# Patient Record
Sex: Female | Born: 1950 | Race: Black or African American | Hispanic: No | Marital: Single | Smoking: Former smoker
Health system: Southern US, Community
[De-identification: ages and names within clinical notes are randomized; demographics above are authoritative.]

## PROBLEM LIST (undated history)

## (undated) DIAGNOSIS — K219 Gastro-esophageal reflux disease without esophagitis: Secondary | ICD-10-CM

## (undated) DIAGNOSIS — F329 Major depressive disorder, single episode, unspecified: Secondary | ICD-10-CM

## (undated) DIAGNOSIS — J45909 Unspecified asthma, uncomplicated: Secondary | ICD-10-CM

## (undated) DIAGNOSIS — J310 Chronic rhinitis: Secondary | ICD-10-CM

## (undated) DIAGNOSIS — M1990 Unspecified osteoarthritis, unspecified site: Secondary | ICD-10-CM

## (undated) DIAGNOSIS — E119 Type 2 diabetes mellitus without complications: Secondary | ICD-10-CM

## (undated) DIAGNOSIS — F32A Depression, unspecified: Secondary | ICD-10-CM

## (undated) DIAGNOSIS — K5792 Diverticulitis of intestine, part unspecified, without perforation or abscess without bleeding: Secondary | ICD-10-CM

## (undated) DIAGNOSIS — J40 Bronchitis, not specified as acute or chronic: Secondary | ICD-10-CM

## (undated) DIAGNOSIS — I10 Essential (primary) hypertension: Secondary | ICD-10-CM

## (undated) DIAGNOSIS — F419 Anxiety disorder, unspecified: Secondary | ICD-10-CM

## (undated) HISTORY — DX: Type 2 diabetes mellitus without complications: E11.9

## (undated) HISTORY — DX: Diverticulitis of intestine, part unspecified, without perforation or abscess without bleeding: K57.92

## (undated) HISTORY — DX: Unspecified asthma, uncomplicated: J45.909

## (undated) HISTORY — DX: Essential (primary) hypertension: I10

## (undated) HISTORY — PX: OTHER SURGICAL HISTORY: SHX169

## (undated) HISTORY — PX: APPENDECTOMY: SHX54

---

## 2007-12-21 ENCOUNTER — Ambulatory Visit: Payer: Self-pay | Admitting: Internal Medicine

## 2008-01-08 ENCOUNTER — Ambulatory Visit: Payer: Self-pay | Admitting: Internal Medicine

## 2008-02-14 ENCOUNTER — Ambulatory Visit: Payer: Self-pay | Admitting: Internal Medicine

## 2008-03-05 ENCOUNTER — Ambulatory Visit: Payer: Self-pay | Admitting: Family Medicine

## 2008-04-27 ENCOUNTER — Ambulatory Visit: Payer: Self-pay | Admitting: Internal Medicine

## 2008-07-02 ENCOUNTER — Ambulatory Visit: Payer: Self-pay | Admitting: Internal Medicine

## 2008-09-25 ENCOUNTER — Ambulatory Visit: Payer: Self-pay | Admitting: Internal Medicine

## 2008-12-18 ENCOUNTER — Ambulatory Visit: Payer: Self-pay | Admitting: Internal Medicine

## 2008-12-19 ENCOUNTER — Ambulatory Visit: Payer: Self-pay | Admitting: Internal Medicine

## 2009-05-18 ENCOUNTER — Ambulatory Visit: Payer: Self-pay | Admitting: Internal Medicine

## 2009-06-11 ENCOUNTER — Ambulatory Visit: Payer: Self-pay | Admitting: Internal Medicine

## 2009-10-18 ENCOUNTER — Ambulatory Visit: Payer: Self-pay | Admitting: Internal Medicine

## 2009-12-26 ENCOUNTER — Ambulatory Visit: Payer: Self-pay | Admitting: Internal Medicine

## 2009-12-30 ENCOUNTER — Ambulatory Visit: Payer: Self-pay | Admitting: Internal Medicine

## 2010-04-04 ENCOUNTER — Ambulatory Visit: Payer: Self-pay | Admitting: Internal Medicine

## 2010-04-18 ENCOUNTER — Ambulatory Visit: Payer: Self-pay | Admitting: Internal Medicine

## 2010-04-22 ENCOUNTER — Ambulatory Visit: Payer: Self-pay | Admitting: Internal Medicine

## 2010-07-19 ENCOUNTER — Ambulatory Visit: Payer: Self-pay | Admitting: Internal Medicine

## 2010-11-06 ENCOUNTER — Ambulatory Visit: Payer: Self-pay | Admitting: Family Medicine

## 2010-12-03 ENCOUNTER — Ambulatory Visit: Payer: Self-pay | Admitting: Family Medicine

## 2010-12-17 ENCOUNTER — Ambulatory Visit: Payer: Self-pay | Admitting: Family Medicine

## 2011-01-14 ENCOUNTER — Ambulatory Visit: Payer: Self-pay | Admitting: Family Medicine

## 2011-01-21 ENCOUNTER — Ambulatory Visit: Payer: Self-pay | Admitting: Family Medicine

## 2011-03-08 ENCOUNTER — Ambulatory Visit: Payer: Self-pay

## 2011-04-21 DIAGNOSIS — K5792 Diverticulitis of intestine, part unspecified, without perforation or abscess without bleeding: Secondary | ICD-10-CM

## 2011-04-21 HISTORY — PX: COLON RESECTION: SHX5231

## 2011-04-21 HISTORY — DX: Diverticulitis of intestine, part unspecified, without perforation or abscess without bleeding: K57.92

## 2011-04-21 HISTORY — PX: HERNIA REPAIR: SHX51

## 2011-05-18 ENCOUNTER — Ambulatory Visit: Payer: Self-pay | Admitting: Internal Medicine

## 2011-05-24 ENCOUNTER — Ambulatory Visit: Payer: Self-pay

## 2011-05-24 LAB — WET PREP, GENITAL

## 2011-06-25 ENCOUNTER — Ambulatory Visit: Payer: Self-pay | Admitting: Family Medicine

## 2011-06-30 ENCOUNTER — Ambulatory Visit: Payer: Self-pay | Admitting: Internal Medicine

## 2011-07-07 ENCOUNTER — Ambulatory Visit: Payer: Self-pay | Admitting: Internal Medicine

## 2011-08-05 ENCOUNTER — Ambulatory Visit: Payer: Self-pay | Admitting: Internal Medicine

## 2011-09-01 ENCOUNTER — Ambulatory Visit: Payer: Self-pay | Admitting: Internal Medicine

## 2011-09-17 ENCOUNTER — Emergency Department: Payer: Self-pay | Admitting: Unknown Physician Specialty

## 2011-09-17 ENCOUNTER — Ambulatory Visit: Payer: Self-pay | Admitting: Medical

## 2011-09-17 LAB — CBC WITH DIFFERENTIAL/PLATELET
Basophil #: 0 10*3/uL (ref 0.0–0.1)
Basophil %: 0.5 %
Eosinophil %: 1.4 %
Lymphocyte %: 34.6 %
MCHC: 32.8 g/dL (ref 32.0–36.0)
MCV: 90 fL (ref 80–100)
Monocyte #: 0.7 x10 3/mm (ref 0.2–0.9)
Neutrophil #: 4.7 10*3/uL (ref 1.4–6.5)
Neutrophil %: 55.6 %
Platelet: 179 10*3/uL (ref 150–440)
RBC: 4.96 10*6/uL (ref 3.80–5.20)
RDW: 13 % (ref 11.5–14.5)
WBC: 8.5 10*3/uL (ref 3.6–11.0)

## 2011-09-17 LAB — COMPREHENSIVE METABOLIC PANEL
Albumin: 3.7 g/dL (ref 3.4–5.0)
Alkaline Phosphatase: 78 U/L (ref 50–136)
BUN: 13 mg/dL (ref 7–18)
Chloride: 105 mmol/L (ref 98–107)
Co2: 27 mmol/L (ref 21–32)
Creatinine: 0.94 mg/dL (ref 0.60–1.30)
Glucose: 110 mg/dL — ABNORMAL HIGH (ref 65–99)
Osmolality: 282 (ref 275–301)
Potassium: 3.5 mmol/L (ref 3.5–5.1)
Sodium: 141 mmol/L (ref 136–145)
Total Protein: 8.2 g/dL (ref 6.4–8.2)

## 2011-09-17 LAB — LIPASE, BLOOD: Lipase: 132 U/L (ref 73–393)

## 2011-09-17 LAB — URINALYSIS, COMPLETE
Bacteria: NONE SEEN
Blood: NEGATIVE
Glucose,UR: NEGATIVE mg/dL (ref 0–75)
Ketone: NEGATIVE
Specific Gravity: 1.01 (ref 1.003–1.030)

## 2011-10-14 ENCOUNTER — Ambulatory Visit: Payer: Self-pay | Admitting: Family Medicine

## 2011-11-17 ENCOUNTER — Ambulatory Visit: Payer: Self-pay | Admitting: Internal Medicine

## 2011-12-03 ENCOUNTER — Ambulatory Visit: Payer: Self-pay | Admitting: Surgery

## 2011-12-03 DIAGNOSIS — I10 Essential (primary) hypertension: Secondary | ICD-10-CM

## 2011-12-03 LAB — HEPATIC FUNCTION PANEL A (ARMC)
Albumin: 3.9 g/dL (ref 3.4–5.0)
Alkaline Phosphatase: 76 U/L (ref 50–136)
Bilirubin, Direct: <0.1 mg/dL (ref 0.00–0.20)
Bilirubin,Total: 0.5 mg/dL (ref 0.2–1.0)
Total Protein: 8.1 g/dL (ref 6.4–8.2)

## 2011-12-03 LAB — BASIC METABOLIC PANEL
Calcium, Total: 9.6 mg/dL (ref 8.5–10.1)
Chloride: 100 mmol/L (ref 98–107)
Co2: 28 mmol/L (ref 21–32)
Creatinine: 0.85 mg/dL (ref 0.60–1.30)
Potassium: 3.7 mmol/L (ref 3.5–5.1)
Sodium: 138 mmol/L (ref 136–145)

## 2011-12-03 LAB — CBC WITH DIFFERENTIAL/PLATELET
Basophil #: 0 10*3/uL (ref 0.0–0.1)
Eosinophil #: 0.1 10*3/uL (ref 0.0–0.7)
HCT: 42.2 % (ref 35.0–47.0)
Lymphocyte #: 1.8 10*3/uL (ref 1.0–3.6)
MCHC: 34 g/dL (ref 32.0–36.0)
MCV: 88 fL (ref 80–100)
Monocyte #: 0.4 x10 3/mm (ref 0.2–0.9)
Neutrophil #: 3.3 10*3/uL (ref 1.4–6.5)
Platelet: 183 10*3/uL (ref 150–440)
RBC: 4.8 10*6/uL (ref 3.80–5.20)
RDW: 12.8 % (ref 11.5–14.5)
WBC: 5.6 10*3/uL (ref 3.6–11.0)

## 2011-12-11 ENCOUNTER — Inpatient Hospital Stay: Payer: Self-pay | Admitting: Surgery

## 2011-12-12 LAB — BASIC METABOLIC PANEL
Anion Gap: 8 (ref 7–16)
Chloride: 107 mmol/L (ref 98–107)
Co2: 26 mmol/L (ref 21–32)
EGFR (African American): >60
EGFR (Non-African Amer.): >60
Osmolality: 282 (ref 275–301)
Potassium: 3.8 mmol/L (ref 3.5–5.1)
Sodium: 141 mmol/L (ref 136–145)

## 2011-12-12 LAB — CBC WITH DIFFERENTIAL/PLATELET
Basophil %: 0.1 %
Eosinophil #: 0 10*3/uL (ref 0.0–0.7)
HCT: 37.4 % (ref 35.0–47.0)
Lymphocyte %: 5.8 %
MCH: 30.7 pg (ref 26.0–34.0)
MCV: 88 fL (ref 80–100)
Monocyte #: 0.7 x10 3/mm (ref 0.2–0.9)
Neutrophil #: 13.6 10*3/uL — ABNORMAL HIGH (ref 1.4–6.5)
Platelet: 178 10*3/uL (ref 150–440)
RDW: 12.9 % (ref 11.5–14.5)

## 2011-12-13 LAB — CBC WITH DIFFERENTIAL/PLATELET
Eosinophil %: 0.2 %
Lymphocyte #: 1.8 10*3/uL (ref 1.0–3.6)
Lymphocyte %: 16.5 %
MCHC: 35.1 g/dL (ref 32.0–36.0)
MCV: 88 fL (ref 80–100)
Monocyte %: 7.2 %
Neutrophil #: 8.2 10*3/uL — ABNORMAL HIGH (ref 1.4–6.5)
Neutrophil %: 75.9 %
Platelet: 179 10*3/uL (ref 150–440)
RDW: 13.2 % (ref 11.5–14.5)
WBC: 10.8 10*3/uL (ref 3.6–11.0)

## 2011-12-13 LAB — BASIC METABOLIC PANEL
Anion Gap: 6 — ABNORMAL LOW (ref 7–16)
BUN: 7 mg/dL (ref 7–18)
Calcium, Total: 8.5 mg/dL (ref 8.5–10.1)
Creatinine: 0.95 mg/dL (ref 0.60–1.30)
EGFR (African American): >60
EGFR (Non-African Amer.): >60
Glucose: 113 mg/dL — ABNORMAL HIGH (ref 65–99)
Osmolality: 280 (ref 275–301)
Potassium: 3.4 mmol/L — ABNORMAL LOW (ref 3.5–5.1)
Sodium: 141 mmol/L (ref 136–145)

## 2011-12-15 LAB — CBC WITH DIFFERENTIAL/PLATELET
Basophil %: 0.3 %
Eosinophil %: 1.2 %
HGB: 11.5 g/dL — ABNORMAL LOW (ref 12.0–16.0)
Lymphocyte #: 1.5 10*3/uL (ref 1.0–3.6)
MCH: 30.8 pg (ref 26.0–34.0)
MCV: 89 fL (ref 80–100)
Monocyte #: 0.6 x10 3/mm (ref 0.2–0.9)
Neutrophil #: 8.6 10*3/uL — ABNORMAL HIGH (ref 1.4–6.5)
Platelet: 174 10*3/uL (ref 150–440)
RBC: 3.73 10*6/uL — ABNORMAL LOW (ref 3.80–5.20)
RDW: 12.6 % (ref 11.5–14.5)

## 2011-12-15 LAB — BASIC METABOLIC PANEL
Calcium, Total: 8.4 mg/dL — ABNORMAL LOW (ref 8.5–10.1)
Chloride: 102 mmol/L (ref 98–107)
Co2: 32 mmol/L (ref 21–32)
Creatinine: 0.71 mg/dL (ref 0.60–1.30)
EGFR (African American): >60
Glucose: 145 mg/dL — ABNORMAL HIGH (ref 65–99)
Potassium: 2.9 mmol/L — ABNORMAL LOW (ref 3.5–5.1)
Sodium: 138 mmol/L (ref 136–145)

## 2011-12-16 LAB — PATHOLOGY REPORT

## 2011-12-17 LAB — CBC WITH DIFFERENTIAL/PLATELET
Basophil %: 0.4 %
Eosinophil %: 2.6 %
HCT: 33.2 % — ABNORMAL LOW (ref 35.0–47.0)
Lymphocyte #: 2.3 10*3/uL (ref 1.0–3.6)
Lymphocyte %: 21.7 %
MCH: 31 pg (ref 26.0–34.0)
MCV: 89 fL (ref 80–100)
Monocyte #: 0.9 x10 3/mm (ref 0.2–0.9)
Monocyte %: 7.9 %
Neutrophil #: 7.3 10*3/uL — ABNORMAL HIGH (ref 1.4–6.5)

## 2011-12-17 LAB — BASIC METABOLIC PANEL
Chloride: 97 mmol/L — ABNORMAL LOW (ref 98–107)
Co2: 35 mmol/L — ABNORMAL HIGH (ref 21–32)
Creatinine: 0.96 mg/dL (ref 0.60–1.30)
EGFR (African American): >60
Sodium: 140 mmol/L (ref 136–145)

## 2012-01-03 ENCOUNTER — Ambulatory Visit: Payer: Self-pay

## 2012-01-07 ENCOUNTER — Ambulatory Visit: Payer: Self-pay

## 2012-03-12 ENCOUNTER — Ambulatory Visit: Payer: Self-pay | Admitting: Internal Medicine

## 2012-03-16 ENCOUNTER — Ambulatory Visit: Payer: Self-pay | Admitting: Internal Medicine

## 2012-06-16 ENCOUNTER — Ambulatory Visit: Payer: Self-pay

## 2012-09-13 ENCOUNTER — Ambulatory Visit: Payer: Self-pay | Admitting: Family Medicine

## 2012-10-20 ENCOUNTER — Ambulatory Visit: Payer: Self-pay

## 2013-04-20 HISTORY — PX: COLONOSCOPY: SHX174

## 2013-09-19 ENCOUNTER — Ambulatory Visit: Payer: Self-pay | Admitting: Family Medicine

## 2013-09-26 ENCOUNTER — Encounter: Payer: Self-pay | Admitting: Family Medicine

## 2013-09-27 ENCOUNTER — Ambulatory Visit: Payer: Self-pay | Admitting: Family Medicine

## 2013-10-13 ENCOUNTER — Ambulatory Visit: Payer: Self-pay

## 2013-10-18 ENCOUNTER — Encounter: Payer: Self-pay | Admitting: Family Medicine

## 2013-10-26 ENCOUNTER — Ambulatory Visit: Payer: Self-pay

## 2014-01-02 ENCOUNTER — Ambulatory Visit: Payer: Self-pay | Admitting: Family Medicine

## 2014-01-08 ENCOUNTER — Ambulatory Visit: Payer: Self-pay

## 2014-01-11 ENCOUNTER — Ambulatory Visit: Payer: Self-pay

## 2014-02-19 ENCOUNTER — Ambulatory Visit: Payer: Self-pay | Admitting: Physician Assistant

## 2014-04-18 ENCOUNTER — Ambulatory Visit: Payer: Self-pay | Admitting: Emergency Medicine

## 2014-04-20 HISTORY — PX: SHOULDER ARTHROSCOPY W/ ROTATOR CUFF REPAIR: SHX2400

## 2014-04-26 ENCOUNTER — Ambulatory Visit: Payer: Self-pay

## 2014-06-08 ENCOUNTER — Ambulatory Visit: Payer: Self-pay

## 2014-06-26 ENCOUNTER — Ambulatory Visit: Payer: Self-pay | Admitting: Specialist

## 2014-06-30 ENCOUNTER — Ambulatory Visit: Payer: Self-pay | Admitting: Emergency Medicine

## 2014-07-16 ENCOUNTER — Ambulatory Visit: Payer: Self-pay | Admitting: Orthopedic Surgery

## 2014-07-16 LAB — URINALYSIS, COMPLETE
BACTERIA: NONE SEEN
Bilirubin,UR: NEGATIVE
Blood: NEGATIVE
Glucose,UR: NEGATIVE mg/dL (ref 0–75)
Ketone: NEGATIVE
Leukocyte Esterase: NEGATIVE
NITRITE: NEGATIVE
Ph: 6 (ref 4.5–8.0)
Protein: NEGATIVE
RBC, UR: NONE SEEN /HPF (ref 0–5)
Specific Gravity: 1.01 (ref 1.003–1.030)
Squamous Epithelial: <1
WBC UR: NONE SEEN /HPF (ref 0–5)

## 2014-07-16 LAB — PROTIME-INR
INR: 1
Prothrombin Time: 12.9 secs

## 2014-07-16 LAB — APTT: ACTIVATED PTT: 26.9 s (ref 23.6–35.9)

## 2014-07-16 LAB — BASIC METABOLIC PANEL
Anion Gap: 8 (ref 7–16)
BUN: 10 mg/dL
CALCIUM: 9.3 mg/dL
Chloride: 104 mmol/L
Co2: 27 mmol/L
Creatinine: 0.78 mg/dL
EGFR (African American): >60
Glucose: 116 mg/dL — ABNORMAL HIGH
POTASSIUM: 3.6 mmol/L
Sodium: 139 mmol/L

## 2014-07-16 LAB — CBC
HCT: 40.4 % (ref 35.0–47.0)
HGB: 13.3 g/dL (ref 12.0–16.0)
MCH: 30.1 pg (ref 26.0–34.0)
MCHC: 33 g/dL (ref 32.0–36.0)
MCV: 91 fL (ref 80–100)
PLATELETS: 246 10*3/uL (ref 150–440)
RBC: 4.43 10*6/uL (ref 3.80–5.20)
RDW: 13.4 % (ref 11.5–14.5)
WBC: 7 10*3/uL (ref 3.6–11.0)

## 2014-07-16 LAB — MRSA PCR SCREENING

## 2014-07-26 ENCOUNTER — Ambulatory Visit
Admit: 2014-07-26 | Disposition: A | Discharge status: Home / Self Care | Payer: Self-pay | Attending: Orthopedic Surgery | Admitting: Orthopedic Surgery

## 2014-08-07 NOTE — Op Note (Signed)
PATIENT NAME:  Brenda Lin, Brenda Lin MR#:  [id number] DATE OF BIRTH:  01/17/1951  DATE OF PROCEDURE:  12/11/2011  PREOPERATIVE DIAGNOSIS: recurrent sigmoid diverticulitis   POSTOPERATIVE DIAGNOSIS: same.   PROCEDURES:  1. Laparoscopic assisted sigmoid colectomy.  2. Appendectomy.   SURGEON: Jadden Yim A. Marina Gravel, MD   ASSISTANT: Dr. Genevive Bi, MD   ANESTHESIA: General oral endotracheal.   FINDINGS: Scarring of the sigmoid colon, normal appendix.   SPECIMENS: Portion of sigmoid colon and appendix to pathology.  ESTIMATED BLOOD LOSS: 100 mL.   DRAINS: None.  LAP AND NEEDLE COUNT: Correct x2.   DESCRIPTION OF PROCEDURE: With the patient in the supine position, general endotracheal anesthesia was induced. She was positioned in dorsal lithotomy. Foley catheter was placed. Hair was clipped on the abdomen. Perineum was prepped with Betadine. Abdomen with choraprep solution. Time-out was observed. A 12 mm hasson was placed through a supraumbilical midline skin incision. Under direct visualization pneumoperitoneum was established. A 5 mm trocar was placed in the right lower quadrant, one in the left upper quadrant, one in the left lower quadrant. The sigmoid colon was reflected off the white line of Toldt utilizing the laparoscopic LigaSure device. Splenic flexure was likewise delivered. At this point the sigmoid colon following mobilization of the splenic flexure was sufficiently redundant that a midline skin incision was fashioned from above the umbilicus to a couple of centimeters to below the umbilicus. Skin was divided with a scalpel and electrocautery used to divide the musculofascial layers. Self-retaining retractor was placed. Sigmoid colon was redundant and the proximal portion was divided in the healthy appearing segment with a single fire of  Contour 40 stapler. The sigmoid mesentery was then divided and tied with #0 Vicryl suture as well as the application of the LigaSure device. Distally a segment of  healthy  distal sigmoid colon was divided with the countour 40 stapler.   Plan initially was to performed a stapled end-to end nastomosis with the 25 mm ILS stapler but the stapler was unable to be safely passed via the rectum to the distal staple line.  As such, a hand sewn double layered end-to end anastomosis was then created with 3-0 silk and 3-0 pds as running Connel type suture.  5 ml of fibrin glue was placed on the staple line and the anastomosis was tested with dital insluffation until saline irigation along with proximal occlusion.   An incidental appendectomy was performed with division of the appendix at its base with a GIA-75 stapler and the mesoappendix with Ligasure energy device.  The staple inewas imbricated with a 3-0 silk sutures.     The abdomen was then thoroughly irrigated with one liter of normal saline and aspirated dry Subcutaneous tissues were irrigated with saine and dilute betadiene.  Skin edges were reapproximated utilizing a skin stapler. Sterile dressings were applied. The patient was then subsequently extubated and taken to the recovery room in stable and satisfactory condition by anesthesia services.   ____________________________ Jeannette How Marina Gravel, MD FACS mab:drc D: 12/12/2011 22:07:02 ET T: 12/13/2011 11:29:12 ET JOB#: [id number]  cc: Elta Guadeloupe A. Marina Gravel, MD, <Dictator> Quinterrius Errington A Render Marley MD ELECTRONICALLY SIGNED 12/14/2011 10:04

## 2014-08-07 NOTE — Discharge Summary (Signed)
PATIENT NAME:  Brenda Lin, Brenda Lin MR#:  [id number] DATE OF BIRTH:  Dec 21, 1950  DATE OF ADMISSION:  12/11/2011 DATE OF DISCHARGE:  12/18/2011  FINAL DIAGNOSES:  1. Recurrent sigmoid diverticulitis.  2. Hypertension.  3. Obesity.   PRINCIPLE PROCEDURE: Laparoscopic assisted sigmoid colectomy.   HOSPITAL COURSE SUMMARY: The patient was brought to the Operating Room on 08/23 where a laparoscopic assisted sigmoid colectomy was performed. Postoperatively she did very well. She had resolution of her ileus by postoperative day #3. Her diet was able to be slowly advanced. She had adequate pain control with use of PCA which was then converted over to oral tramadol and Percocet. She was also given a trial of Toradol which she tolerated well. Her potassium was somewhat low and was repleted. Her hemoglobin remained stable. Wound was healing nicely and by postoperative day #7 the patient was deemed suitable for discharge.   DISCHARGE MEDICATIONS:  1. Percocet 5/325, 1 to 2 tabs every 4 to 6 hours as needed for pain.  2. Meloxicam 50 mg by mouth once a day. 3. Proventil HFA 2 puffs inhaled q.i.d. as needed. 4. Phenergan as needed.  5. Metformin 500 mg by mouth once a day. 6. Albuterol inhaler as needed.  7. Aldactone 25 mg by mouth b.i.d.  8. Allopurinol 300 mg by mouth once a day. 9. Indomethacin 25 mg by mouth 3 times a day as needed for pain.  10. Pravastatin 40 mg by mouth once a day at bedtime. 11. Diazepam 5 mg by mouth once a day as needed.  12. Amlodipine 5 mg by mouth once a day in the morning. 13. Hydrochlorothiazide 25 mg by mouth 1 tab in the morning.    FOLLOW UP: Follow up with me on Tuesday, 09/03, for staple removal. Call with any questions or concerns.   ____________________________ Jeannette How Marina Gravel, MD mab:cms D: 12/18/2011 08:26:46 ET T: 12/18/2011 10:50:39 ET JOB#: [id number]  cc: Elta Guadeloupe A. Marina Gravel, MD, <Dictator>  Hortencia Conradi MD ELECTRONICALLY SIGNED 12/18/2011 18:19

## 2014-08-19 NOTE — Op Note (Addendum)
PATIENT NAME:  Brenda Lin, Brenda Lin MR#:  [id number] DATE OF BIRTH:  Dec 31, 1950  DATE OF PROCEDURE:  07/26/2014  PREOPERATIVE DIAGNOSES: Left shoulder rotator cuff tear, subacromial impingement, and acromioclavicular joint arthrosis.   POSTOPERATIVE DIAGNOSES:  Left shoulder rotator cuff tear, subacromial impingement, and acromioclavicular joint arthrosis.   PROCEDURE: Left shoulder arthroscopic subacromial decompression and distal clavicle excision with mini open rotator cuff tear repair.   SURGEON: Timoteo Gaul, MD   ANESTHESIA: General with local with 1% lidocaine plain for the incisions, and 0.25% Marcaine plain infusion into the subacromial space at the conclusion of the case.   ESTIMATED BLOOD LOSS: Minimal.   COMPLICATIONS: None.   IMPLANTS: ArthroCare Magnum M anchor x 1 and Magnum 2 anchors x 3.   INDICATIONS FOR PROCEDURE: The patient is a 64 year old female who has had persistent pain and limitation of motion with the left shoulder. An MRI has revealed a full-thickness rotator cuff tear involving the supraspinatus and infraspinatus with retraction. I had recommended surgical fixation for this when she did not respond to nonoperative management. I had reviewed the risks and benefits of the procedure with the patient, which includes infection, bleeding, nerve or blood vessel injury, shoulder stiffness, persistent left shoulder pain or weakness, failure of the hardware, and the need for further surgery. Medical risks include, but are not limited to DVT and pulmonary embolism, myocardial infarction, stroke, pneumonia, respiratory failure, and death. The patient understood these risks and wished to proceed.   The patient was met in the preoperative area. I answered all questions by the patient and her family. I marked the left shoulder with the word "yes," according to the hospital's correct site of surgery protocol after verbally confirming with the patient that this is the correct site  of surgery and confirming with my office notes and radiographic studies.   DESCRIPTION OF PROCEDURE: The patient was brought to the operating room. She had a history of COPD and was, therefore, not given an interscalene block. She underwent general anesthesia and was positioned in the beach chair position. All bony prominences were adequately padded, including the lower extremities. A Spider arm positioner was used for this case. The patient was prepped and draped in a sterile fashion. A timeout was performed to verify the patient's name, date of birth, medical record number, correct site of surgery, and correct procedure to be performed. It was also used to verify this patient had received antibiotics and that all proper instruments, implants and radiographic studies were available in the room. Once all in attendance were in agreement, the case began.   Examination under anesthesia revealed no instability to load and shift testing in the anterior and posterior direction. She had negative sulcus sign and full passive range of motion.   The patient had bony landmarks drawn out with a surgical marker, along with proposed arthroscopy incisions. These were pre- injected with 1% lidocaine plain. An 11 blade was used to establish the posterior portal, through which the arthroscope was placed into the glenohumeral joint. A full diagnostic examination of the shoulder was undertaken. Findings on arthroscopy included fraying of the anterior, superior, and posterior labrum without detachment from the bony glenoid. The biceps tendon anchor appeared to be intact. The patient did have moderate chondral wear of the glenoid and humeral head. The patient had a full-thickness tear of the supraspinatus and infraspinatus with retraction to the glenoid. There was a partial thickness tear involving the inferior fibers of the subscapularis, but there was not  complete detachment from the lesser tuberosity. An anterior portal was  established using an 18-gauge spinal needle for localization. A 4-0 resector shaver blade was placed through this anterior portal, and the debridement of the frayed edges of the labrum was performed, along with debridement of the torn edges of the rotator cuff, and any remaining fibers of the rotator cuff on the greater tuberosity. The arthroscope was placed into the subacromial space. A lateral portal was established; again, using an 18-gauge spinal needle for localization. A subacromial decompression was performed through this lateral portal using a 5.5 mm resector shaver blade. A distal clavicle excision was also performed using the 5.5 mm resector blade through the anterior portal. Through the lateral portal, 3 Smart stitches were placed on the lateral edge of the rotator cuff tear. The tear was evaluated through the anterior, posterior, and lateral portals to ensure that the sutures were appropriately placed. An arthroscopic elevator was used to mobilize the rotator cuff tendon.   All arthroscopic instruments were then removed. A saber-type incision was made along the lateral border of the acromion. The deltoid fascia was identified and split vertically in line with its fibers. A self-retaining shoulder retractor was placed to allow for visualization of the rotator cuff tear. The Smart stitch sutures placed earlier were then brought out through the deltoid split. A 5.5 mm resector shaver blade was then used to gently burr the greater tuberosity, removing all remaining torn fibers of the rotator cuff. Punctate bleeding was identified, which will assist in healing of the rotator cuff tear. A Magnum M anchor was then placed at the articular margin of the humeral head. The four sutures from this anchor were then passed through the medial portion of the torn resector cuff using a first-pass suture passer. These sutures were then clamped for later repair. The Smart stitches originally placed were then placed using  Magnum 2 anchors for fixation to the lateral row. These were tensioned to allow for reduction of the torn rotator cuff to the greater tuberosity. Once there was adequate coverage of the humeral hand, a medial row was then tied down using an arthroscopic knot pusher using an arthroscopic knot-tying technique. Final images of the rotator cuff repair were taken both externally and arthroscopically from the glenohumeral joint. There was excellent approximation of the rotator cuff to the greater tuberosity. The subacromial space and the glenohumeral joint were then copiously irrigated. Final images were taken of the rotator cuff repair using the arthroscope and all instruments were then removed. The deltoid fascia was closed using an interrupted 0 Vicryl. The subcutaneous tissue of all incisions were closed with 2-0 Vicryl, and the skin of the 3 arthroscopic portals was closed with 4-0 nylon, and the skin of the saber incision was closed with a running 4-0 undyed Monocryl. Steri-Strips were applied, along with a dry sterile dressing. TENS unit leads and a Polar Care sleeve were also applied, along with an abduction sling to the left shoulder. The patient was then awakened and transferred to a hospital stretcher and brought to the PACU in stable condition. I spoke with the patient's family, postoperatively, to let them know the case had gone without complication, and the patient was stable in the recovery.. The patient was neurovascularly intact in the left upper extremity in the PACU.    ____________________________ Timoteo Gaul, MD klk:mw D: 07/27/2014 14:02:32 ET T: 07/27/2014 14:29:38 ET JOB#: [id number]  cc: Timoteo Gaul, MD, <Dictator> Timoteo Gaul MD ELECTRONICALLY SIGNED 08/20/2014  12:59

## 2014-09-24 ENCOUNTER — Encounter: Payer: Self-pay | Admitting: *Deleted

## 2014-10-02 ENCOUNTER — Encounter: Payer: Self-pay | Admitting: General Surgery

## 2014-10-02 ENCOUNTER — Other Ambulatory Visit: Payer: Medicare Other

## 2014-10-02 ENCOUNTER — Ambulatory Visit (INDEPENDENT_AMBULATORY_CARE_PROVIDER_SITE_OTHER): Payer: Medicare Other | Admitting: General Surgery

## 2014-10-02 VITALS — BP 122/82 | HR 80 | Resp 14 | Ht 65.0 in | Wt 180.0 lb

## 2014-10-02 DIAGNOSIS — N6452 Nipple discharge: Secondary | ICD-10-CM | POA: Diagnosis not present

## 2014-10-02 NOTE — Patient Instructions (Addendum)
The patient is aware to call back for any questions or concerns. Continue self breast exams. Call office for any new breast issues or concerns. 

## 2014-10-02 NOTE — Progress Notes (Addendum)
Patient ID: Brenda Lin, female   DOB: Dec 30, 1950, 64 y.o.   MRN: [id number]  Chief Complaint  Patient presents with  . Other    Eval MRSA on nipple    HPI Brenda Lin is a 64 y.o. female here today for an evaluation of possible MRSA involving the left nipple. She got a hydrocortisone injection in the left shoulder March 2016. After that she then developed a irritation on her right flank that was reported as MRSA (although no culture was obtained) and she was treated with oral and a biotics with complete resolution. She noticed a cyst/crust area on the left nipple shortly after this and it would drain white drainage with odor when she squeezed the area. No spontaneous drainage.    07-16-14 nasal screen negative for MRSA. She then had shoulder surgery in April by Dr. Mack Guise.   She denies pain to the nipple. She does use Bactroban ointment to nipple twice a day. She not seen any drainage today but it did drain last night. No cultures have been taken.    HPI  Past Medical History  Diagnosis Date  . Hypertension   . Diabetes mellitus without complication   . Asthma   . Diverticulitis 2013    Past Surgical History  Procedure Laterality Date  . Hernia repair  2013  . Appendectomy    . Shoulder arthroscopy w/ rotator cuff repair Left 2016  . Colonoscopy  2015    Dr Ernst Breach  . Colon resection  2013    Dr Marina Gravel    Family History  Problem Relation Age of Onset  . Cancer Maternal Grandmother     breast  . Cancer Sister     breast age 63"s  . Cancer Maternal Aunt     breast    Social History History  Substance Use Topics  . Smoking status: Former Smoker    Quit date: 04/21/2011  . Smokeless tobacco: Never Used  . Alcohol Use: No    Allergies  Allergen Reactions  . Vicodin [Hydrocodone-Acetaminophen] Itching    Current Outpatient Prescriptions  Medication Sig Dispense Refill  . ACCU-CHEK AVIVA PLUS test strip     . amLODipine (NORVASC) 5 MG tablet TK 1 T  PO QD  11  . aspirin 325 MG EC tablet Take 325 mg by mouth daily.    . fluticasone (FLONASE) 50 MCG/ACT nasal spray as needed.     Marland Kitchen ibuprofen (ADVIL,MOTRIN) 800 MG tablet TK 1 T PO Q 8 H PRN  2  . meloxicam (MOBIC) 7.5 MG tablet TK 1 T PO BID  1  . metFORMIN (GLUCOPHAGE-XR) 500 MG 24 hr tablet TK 1 T PO QD  0  . mupirocin ointment (BACTROBAN) 2 % APPLY TOPICALLY TO AFFECTED AREA BID FOR 11 DAYS  0  . omeprazole (PRILOSEC) 40 MG capsule Take 40 mg by mouth daily.    Marland Kitchen oxyCODONE (OXY IR/ROXICODONE) 5 MG immediate release tablet TK 1 T PO Q 4 TO 6 H PRN  0  . PROAIR HFA 108 (90 BASE) MCG/ACT inhaler INL 2 PFS PO Q 6 H PRF WHZ  11  . promethazine (PHENERGAN) 25 MG tablet TK 1 T PO Q 6 H  0  . spironolactone-hydrochlorothiazide (ALDACTAZIDE) 25-25 MG per tablet Take 1 tablet by mouth daily.      No current facility-administered medications for this visit.    Review of Systems Review of Systems  All other systems reviewed and are negative.   Blood  pressure 122/82, pulse 80, resp. rate 14, height 5\' 5"  (1.651 m), weight 180 lb (81.647 kg).  Physical Exam Physical Exam  Constitutional: She is oriented to person, place, and time. She appears well-developed and well-nourished.  Neck: Neck supple.  Cardiovascular: Normal rate, regular rhythm and normal heart sounds.   Pulmonary/Chest: Effort normal and breath sounds normal. Right breast exhibits no inverted nipple, no mass, no nipple discharge, no skin change and no tenderness. Left breast exhibits nipple discharge. Left breast exhibits no inverted nipple, no mass, no skin change and no tenderness.  Drainage left nipple at transverse crease.  Lymphadenopathy:    She has no cervical adenopathy.    She has no axillary adenopathy.  Neurological: She is alert and oriented to person, place, and time.  Skin: Skin is warm and dry.    Data Reviewed Mammograms completed fall 2015 were completed in [place], [place] and are not available for  review but have been requested.  Ultrasound examination of the left breast was completed to evaluate for ductal dilatation. Rotation of the retroareolar area measuring up to 0.23 cm in diameter without intraductal lesions are appreciated. There is no increased vascularity in the retroareolar tissue. BI-RADS-2.  Assessment    Nipple drainage unlikely related to deep MRSA infection. Aggravated by continued manipulation.    Plan    Culture was obtained. If negative, we'll plan for reassessment and the patient has been discouraged from manipulating the breast.    Follow up in 3 months provided the culture is negative.   PCP: [place] Ref: Dr. Margarita Rana, [place] Gleason 10/03/2014, 5:53 PM   Mammograms completed at [facility] on 12/01/2013 are now available for review as well as the official interpretation. No interval change over the past several years. No suspicious microcalcifications or densities. BI-RADS-2.  No change in plan as noted above.

## 2014-10-03 DIAGNOSIS — N6452 Nipple discharge: Secondary | ICD-10-CM | POA: Insufficient documentation

## 2014-10-07 LAB — ANAEROBIC AND AEROBIC CULTURE

## 2014-10-08 ENCOUNTER — Telehealth: Payer: Self-pay

## 2014-10-08 NOTE — Telephone Encounter (Signed)
-----   Message from Robert Bellow, MD sent at 10/08/2014 10:59 AM EDT ----- Please notify the patient that I have reviewed her mammograms from [facility] and they are fine.  The culture results do not show evidence of MRSA.  I would like her to wash the left nipple with mild soap and water, rinse well and patch dry twice a day followed by the application of a small film of Neosporin ointment (Triple Antibiotic). I would like to recheck her in 2 weeks by than waiting 3 months as originally planned. Thank yo ----- Message -----    From: [facility] Lab Results In Interface    Sent: 10/07/2014   7:37 AM      To: Robert Bellow, MD

## 2014-10-08 NOTE — Telephone Encounter (Signed)
-----   Message from Robert Bellow, MD sent at 10/08/2014 10:59 AM EDT ----- Please notify the patient that I have reviewed her mammograms from [facility] and they are fine.  The culture results do not show evidence of MRSA.  I would like her to wash the left nipple with mild soap and water, rinse well and patch dry twice a day followed by the application of a small film of Neosporin ointment (Triple Antibiotic). I would like to recheck her in 2 weeks by than waiting 3 months as originally planned. Thank yo ----- Message -----    From: [facility] Lab Results In Interface    Sent: 10/07/2014   7:37 AM      To: Robert Bellow, MD

## 2014-10-08 NOTE — Telephone Encounter (Signed)
Notified patient as instructed, patient pleased. Discussed follow-up appointment, patient agrees.

## 2014-10-31 ENCOUNTER — Telehealth: Payer: Self-pay | Admitting: General Surgery

## 2014-10-31 NOTE — Telephone Encounter (Signed)
PT CALLED IN STILL HAVING TROUBLE WITH HER LT BR.STATES ITS STILL DRAINING & HER NIPPLE STAYES HARD.SHE BUMPED IT &THERE WAS SOME BLEEDING..SHE HAS AN APPT TO SEE DR BYRNETT 11-08-14.BUT SHE WOULD LIKE TO KNOW WHAT TO DO UNTIL HER APPT.DR BYRNETT TOLD HER TO PUT NEOSPORIN ON THE AREA & SHE FEELS ITS JUST KEEP THE AREA MOIST.PLEASE DIRECT.

## 2014-11-01 NOTE — Telephone Encounter (Signed)
Patient states that she is just concerned as the area is still draining. She is not having a lot of discomfort but did want some advise on what she can do until she is seen on 11/08/14. I advised her to use heat 3-4 times daily to the area for comfort. She is advisable to this.

## 2014-11-08 ENCOUNTER — Encounter: Payer: Self-pay | Admitting: General Surgery

## 2014-11-08 ENCOUNTER — Ambulatory Visit (INDEPENDENT_AMBULATORY_CARE_PROVIDER_SITE_OTHER): Payer: Medicare Other | Admitting: General Surgery

## 2014-11-08 VITALS — BP 120/80 | HR 72 | Resp 16 | Ht 65.0 in | Wt 184.0 lb

## 2014-11-08 DIAGNOSIS — N6452 Nipple discharge: Secondary | ICD-10-CM | POA: Diagnosis not present

## 2014-11-08 MED ORDER — SULFAMETHOXAZOLE-TRIMETHOPRIM 800-160 MG PO TABS: 1.0000 | ORAL_TABLET | Freq: Two times a day (BID) | ORAL | Status: DC

## 2014-11-08 NOTE — Progress Notes (Signed)
Patient ID: Brenda Lin, female   DOB: 01-20-1951, 64 y.o.   MRN: [id number]  Chief Complaint  Patient presents with  . Follow-up    left nipple discharge    HPI Brenda Lin is a 64 y.o. female here today for left breast nipple discharge. Patient states the area is still having some discharge. HPI  Past Medical History  Diagnosis Date  . Hypertension   . Diabetes mellitus without complication   . Asthma   . Diverticulitis 2013    Past Surgical History  Procedure Laterality Date  . Hernia repair  2013  . Appendectomy    . Shoulder arthroscopy w/ rotator cuff repair Left 2016  . Colonoscopy  2015    Dr Ernst Breach  . Colon resection  2013    Dr Marina Gravel    Family History  Problem Relation Age of Onset  . Cancer Maternal Grandmother     breast  . Cancer Sister     breast age 13"s  . Cancer Maternal Aunt     breast    Social History History  Substance Use Topics  . Smoking status: Former Smoker    Quit date: 04/21/2011  . Smokeless tobacco: Never Used  . Alcohol Use: No    Allergies  Allergen Reactions  . Vicodin [Hydrocodone-Acetaminophen] Itching    Current Outpatient Prescriptions  Medication Sig Dispense Refill  . ACCU-CHEK AVIVA PLUS test strip     . amLODipine (NORVASC) 5 MG tablet TK 1 T PO QD  11  . aspirin 325 MG EC tablet Take 325 mg by mouth daily.    . fluticasone (FLONASE) 50 MCG/ACT nasal spray as needed.     Marland Kitchen ibuprofen (ADVIL,MOTRIN) 800 MG tablet TK 1 T PO Q 8 H PRN  2  . meloxicam (MOBIC) 7.5 MG tablet TK 1 T PO BID  1  . metFORMIN (GLUCOPHAGE-XR) 500 MG 24 hr tablet TK 1 T PO QD  0  . mupirocin ointment (BACTROBAN) 2 % APPLY TOPICALLY TO AFFECTED AREA BID FOR 11 DAYS  0  . omeprazole (PRILOSEC) 40 MG capsule Take 40 mg by mouth daily.    Marland Kitchen oxyCODONE (OXY IR/ROXICODONE) 5 MG immediate release tablet TK 1 T PO Q 4 TO 6 H PRN  0  . PROAIR HFA 108 (90 BASE) MCG/ACT inhaler INL 2 PFS PO Q 6 H PRF WHZ  11  . promethazine (PHENERGAN) 25 MG  tablet TK 1 T PO Q 6 H  0  . spironolactone-hydrochlorothiazide (ALDACTAZIDE) 25-25 MG per tablet Take 1 tablet by mouth daily.     Marland Kitchen sulfamethoxazole-trimethoprim (BACTRIM DS) 800-160 MG per tablet Take 1 tablet by mouth 2 (two) times daily. 30 tablet 0   No current facility-administered medications for this visit.    Review of Systems Review of Systems  Constitutional: Negative.   Respiratory: Negative.   Cardiovascular: Negative.     Blood pressure 120/80, pulse 72, resp. rate 16, height 5\' 5"  (1.651 m), weight 184 lb (83.462 kg).  Physical Exam Physical Exam  Constitutional: She is oriented to person, place, and time. She appears well-developed and well-nourished.  Eyes: Conjunctivae are normal. No scleral icterus.  Neck: Neck supple.  Pulmonary/Chest: Left breast exhibits nipple discharge ( left nipple creamy discharge from a single duct). Left breast exhibits no inverted nipple, no mass, no skin change and no tenderness.    Lymphadenopathy:    She has no cervical adenopathy.  Neurological: She is alert and oriented to person,  place, and time.  Skin: Skin is warm and dry.    Data Reviewed October 02, 2014 skin culture from nipple crease: Anaerobic Culture Final report   Result 1 Comment   Comments: No anaerobic growth in 72 hours.   Aerobic Culture Final report (A)   Result 1 Comment (A)   Comments: Acinetobacter calcoaceticus baumannii complex  Heavy growth     Result 2 Comment (A)   Comments: Pseudomonas aeruginosa  Moderate growth            Assessment    Persistent discharge, no previous ductal dilatation on ultrasound.    Plan    Will treat for 2 weeks with Bactrim. I don't think the Pseudomonas require specific treatment. If she fails to respond we'll discuss duct excision.    Patient to return  In one month.  PCP:  Janey Genta, Forest Gleason 11/10/2014, 9:05 AM

## 2014-11-08 NOTE — Patient Instructions (Signed)
Patient to return in ine month.  

## 2014-11-12 ENCOUNTER — Telehealth: Payer: Self-pay | Admitting: *Deleted

## 2014-11-12 NOTE — Telephone Encounter (Signed)
Called pharmacy and spoke with Kem Parkinson and they will have the prescription sent to the [facility] in [place] on [address]. Spoke with the patient and let her know.

## 2014-11-12 NOTE — Telephone Encounter (Signed)
Patients medication Bactrim was sent to the wrong [facility]. It was sent to [facility] on [address] in [place]. The patient wanted it to be sent to the one on [address] in [place].

## 2014-12-25 ENCOUNTER — Ambulatory Visit: Payer: Medicare Other | Admitting: General Surgery

## 2015-01-17 ENCOUNTER — Encounter: Payer: Self-pay | Admitting: *Deleted

## 2015-02-11 ENCOUNTER — Ambulatory Visit: Payer: Medicare Other | Admitting: General Surgery

## 2015-02-12 ENCOUNTER — Telehealth: Payer: Self-pay | Admitting: *Deleted

## 2015-02-12 NOTE — Telephone Encounter (Signed)
-----   Message from Robert Bellow, MD sent at 02/11/2015  4:40 PM EDT ----- Patient missed appt. See is she is still having drainage, if so, she should f/u.  If not, no f/u required.

## 2015-02-12 NOTE — Telephone Encounter (Signed)
I talked with the patient and she completely forgot about the appointment and she states she did not get a reminder call. She is better and is not having any drainage. Appreciates phone call. The patient is aware to call back for any questions or concerns.

## 2015-07-17 ENCOUNTER — Ambulatory Visit
Admission: EM | Admit: 2015-07-17 | Discharge: 2015-07-17 | Disposition: A | Discharge status: Home / Self Care | Payer: Medicare Other | Attending: Family Medicine | Admitting: Family Medicine

## 2015-07-17 ENCOUNTER — Ambulatory Visit (INDEPENDENT_AMBULATORY_CARE_PROVIDER_SITE_OTHER): Payer: Medicare Other

## 2015-07-17 DIAGNOSIS — Z8639 Personal history of other endocrine, nutritional and metabolic disease: Secondary | ICD-10-CM | POA: Diagnosis not present

## 2015-07-17 DIAGNOSIS — J209 Acute bronchitis, unspecified: Secondary | ICD-10-CM

## 2015-07-17 DIAGNOSIS — I10 Essential (primary) hypertension: Secondary | ICD-10-CM | POA: Diagnosis not present

## 2015-07-17 HISTORY — DX: Gastro-esophageal reflux disease without esophagitis: K21.9

## 2015-07-17 LAB — RAPID INFLUENZA A&B ANTIGENS
Influenza A (ARMC): NEGATIVE
Influenza B (ARMC): NEGATIVE

## 2015-07-17 MED ORDER — DEXAMETHASONE SODIUM PHOSPHATE 10 MG/ML IJ SOLN
10.0000 mg | Freq: Once | INTRAMUSCULAR | Status: AC
  Administered 2015-07-17: 10 mg via INTRAMUSCULAR

## 2015-07-17 MED ORDER — PREDNISONE 10 MG PO TABS: 20.0000 mg | ORAL_TABLET | Freq: Every day | ORAL | Status: DC

## 2015-07-17 MED ORDER — IPRATROPIUM-ALBUTEROL 0.5-2.5 (3) MG/3ML IN SOLN
3.0000 mL | Freq: Four times a day (QID) | RESPIRATORY_TRACT | Status: DC
  Administered 2015-07-17: 3 mL via RESPIRATORY_TRACT

## 2015-07-17 MED ORDER — AZITHROMYCIN 250 MG PO TABS: 250.0000 mg | ORAL_TABLET | Freq: Every day | ORAL | Status: DC

## 2015-07-17 MED ORDER — ALBUTEROL SULFATE HFA 108 (90 BASE) MCG/ACT IN AERS: 2.0000 | INHALATION_SPRAY | RESPIRATORY_TRACT | Status: AC | PRN

## 2015-07-17 MED ORDER — BENZONATATE 100 MG PO CAPS: 100.0000 mg | ORAL_CAPSULE | Freq: Three times a day (TID) | ORAL | Status: DC

## 2015-07-17 NOTE — ED Provider Notes (Signed)
CSN: [id number]     Arrival date & time 07/17/15  1518 History   First MD Initiated Contact with Patient 07/17/15 1547     Chief Complaint  Patient presents with  . URI   (Consider location/radiation/quality/duration/timing/severity/associated sxs/prior Treatment) HPI Comments: Pt used inhaler PTA without relief.  Patient is a 65 y.o. female presenting with URI. The history is provided by the patient. No language interpreter was used.  URI Presenting symptoms: congestion, cough and fever   Presenting symptoms: no ear pain, no rhinorrhea and no sore throat   Severity:  Moderate Onset quality:  Sudden Duration:  4 days Timing:  Constant Progression:  Unchanged Chronicity:  Recurrent Relieved by:  Nothing Exacerbated by: deep breathing/coughing. Associated symptoms: wheezing   Associated symptoms: no headaches and no myalgias   Risk factors: being elderly and diabetes mellitus   Risk factors comment:  HTN, Asthma hx   Past Medical History  Diagnosis Date  . Hypertension   . Diabetes mellitus without complication ([place])   . Asthma   . Diverticulitis 2013  . Acid reflux    Past Surgical History  Procedure Laterality Date  . Hernia repair  2013  . Appendectomy    . Shoulder arthroscopy w/ rotator cuff repair Left 2016  . Colonoscopy  2015    Dr Ernst Breach  . Colon resection  2013    Dr Marina Gravel   Family History  Problem Relation Age of Onset  . Cancer Maternal Grandmother     breast  . Cancer Sister     breast age 37"s  . Cancer Maternal Aunt     breast   Social History  Substance Use Topics  . Smoking status: Former Smoker    Quit date: 04/21/2011  . Smokeless tobacco: Never Used  . Alcohol Use: No   OB History    Gravida Para Term Preterm AB TAB SAB Ectopic Multiple Living   2 2        2       Obstetric Comments   1st Menstrual Cycle:  13 1st Pregnancy:  20      Review of Systems  Constitutional: Positive for fever. Negative for chills.  HENT: Positive  for congestion. Negative for ear pain, rhinorrhea and sore throat.   Eyes: Negative.   Respiratory: Positive for cough and wheezing.   Gastrointestinal: Negative for nausea and vomiting.  Endocrine: Negative.   Genitourinary: Negative for dysuria.  Musculoskeletal: Negative for myalgias.  Skin: Negative for rash.  Allergic/Immunologic: Negative.   Neurological: Negative for headaches.  Hematological: Negative.   Psychiatric/Behavioral: Negative.   All other systems reviewed and are negative.   Allergies  Vicodin  Home Medications   Prior to Admission medications   Medication Sig Start Date End Date Taking? Authorizing Provider  ACCU-CHEK AVIVA PLUS test strip  07/11/14   Historical Provider, MD  albuterol (PROVENTIL HFA;VENTOLIN HFA) 108 (90 Base) MCG/ACT inhaler Inhale 2 puffs into the lungs every 4 (four) hours as needed for wheezing or shortness of breath. Q000111Q   Tori Milks, NP  amLODipine (NORVASC) 5 MG tablet TK 1 T PO QD 09/04/14   Historical Provider, MD  aspirin 325 MG EC tablet Take 325 mg by mouth daily.    Historical Provider, MD  azithromycin (ZITHROMAX) 250 MG tablet Take 1 tablet (250 mg total) by mouth daily. Take first 2 tablets together, then 1 every day until finished. [id number]   Tori Milks, NP  benzonatate (TESSALON) 100 MG capsule Take 1 capsule (100  mg total) by mouth every 8 (eight) hours. [id number]   Lynann Demetrius, NP  fluticasone (FLONASE) 50 MCG/ACT nasal spray as needed.  10/01/14   Historical Provider, MD  ibuprofen (ADVIL,MOTRIN) 800 MG tablet TK 1 T PO Q 8 H PRN 09/09/14   Historical Provider, MD  meloxicam (MOBIC) 7.5 MG tablet TK 1 T PO BID 08/29/14   Historical Provider, MD  metFORMIN (GLUCOPHAGE-XR) 500 MG 24 hr tablet TK 1 T PO QD 09/13/14   Historical Provider, MD  mupirocin ointment (BACTROBAN) 2 % APPLY TOPICALLY TO AFFECTED AREA BID FOR 11 DAYS 06/30/14   Historical Provider, MD  omeprazole (PRILOSEC) 40 MG capsule Take 40 mg by mouth  daily.    Historical Provider, MD  predniSONE (DELTASONE) 10 MG tablet Take 2 tablets (20 mg total) by mouth daily with breakfast. X 5 days, Disp # 10, no refills Q000111Q   Tori Milks, NP  promethazine (PHENERGAN) 25 MG tablet TK 1 T PO Q 6 H 07/26/14   Historical Provider, MD  spironolactone-hydrochlorothiazide (ALDACTAZIDE) 25-25 MG per tablet Take 1 tablet by mouth daily.  09/29/14   Historical Provider, MD   Meds Ordered and Administered this Visit   Medications  ipratropium-albuterol (DUONEB) 0.5-2.5 (3) MG/3ML nebulizer solution 3 mL (3 mLs Nebulization Given 07/17/15 1642)  dexamethasone (DECADRON) injection 10 mg (10 mg Intramuscular Given 07/17/15 1651)    BP 147/91 mmHg  Pulse 117  Temp(Src) 98.5 F (36.9 C) (Oral)  Resp 20  Ht 5\' 5"  (1.651 m)  Wt 195 lb (88.451 kg)  BMI 32.45 kg/m2  SpO2 96% No data found.   Physical Exam  Constitutional: She is oriented to person, place, and time. She appears well-developed and well-nourished. She is active and cooperative. No distress.  HENT:  Head: Normocephalic.  Right Ear: Tympanic membrane is retracted.  Left Ear: Tympanic membrane is retracted.  Nose: Mucosal edema present.  Mouth/Throat: Uvula is midline, oropharynx is clear and moist and mucous membranes are normal.  Eyes: Conjunctivae, EOM and lids are normal. Pupils are equal, round, and reactive to light.  Neck: Normal range of motion. No tracheal deviation present.  Cardiovascular: Regular rhythm, normal heart sounds and normal pulses.  Tachycardia present.   No murmur heard. Pt used inhaler PTA aware of tachycardia, denies CP in office  Pulmonary/Chest: Effort normal and breath sounds normal.  Abdominal: Soft. Bowel sounds are normal. There is no tenderness.  Musculoskeletal: Normal range of motion.  Lymphadenopathy:    She has no cervical adenopathy.  Neurological: She is alert and oriented to person, place, and time. No cranial nerve deficit or sensory deficit.  GCS eye subscore is 4. GCS verbal subscore is 5. GCS motor subscore is 6.  Skin: Skin is warm and dry. No rash noted.  Psychiatric: She has a normal mood and affect. Her speech is normal and behavior is normal.  Nursing note and vitals reviewed.   ED Course  Procedures (including critical care time)  Labs Review Labs Reviewed  RAPID INFLUENZA A&B ANTIGENS [facility] ONLY)    Imaging Review Dg Chest 2 View  07/17/2015  CLINICAL DATA:  65 year old female with cough and headache for 3 days. Initial encounter. EXAM: CHEST  2 VIEW COMPARISON:  Lung volumes are stable and within normal limits. Normal cardiac size and mediastinal contours. Visualized tracheal air column is within normal limits. No pneumothorax, pulmonary edema, pleural effusion or confluent pulmonary opacity. No acute osseous abnormality identified. FINDINGS: No acute cardiopulmonary abnormality. IMPRESSION: No active cardiopulmonary  disease. Electronically Signed   By: Genevie Ann M.D.   On: 07/17/2015 16:40         MDM   1. Bronchitis, acute, with bronchospasm   2. Essential hypertension   3. Hx of diabetes mellitus     CXR/influenza ordered, duoneb given, decadron 10 mg Im in office for wheezing in office.  CXR/flu negative for acute findings. Pt states feels better after duoneb treatment. Keep an eye on your blood sugar as prednisone may make it go up. Take meds as directed. Follow up with PCP for general medical issues, recheck BP next week as it was elevated in Er. Return to ER for new or worsening issues. Pt verbalized understanding to this provider.    Tori Milks, NP [id number]

## 2015-07-17 NOTE — ED Notes (Signed)
Patient c/o cough, headache, hot flashes, body aches, and congestion which started Sunday night.  Denies fever c/n/v or chest pain.

## 2015-07-17 NOTE — Discharge Instructions (Signed)
Acute Bronchitis Bronchitis is when the airways that extend from the windpipe into the lungs get red, puffy, and painful (inflamed). Bronchitis often causes thick spit (mucus) to develop. This leads to a cough. A cough is the most common symptom of bronchitis. In acute bronchitis, the condition usually begins suddenly and goes away over time (usually in 2 weeks). Smoking, allergies, and asthma can make bronchitis worse. Repeated episodes of bronchitis may cause more lung problems. HOME CARE  Rest.  Drink enough fluids to keep your pee (urine) clear or pale yellow (unless you need to limit fluids as told by your doctor).  Only take over-the-counter or prescription medicines as told by your doctor.  Avoid smoking and secondhand smoke. These can make bronchitis worse. If you are a smoker, think about using nicotine gum or skin patches. Quitting smoking will help your lungs heal faster.  Reduce the chance of getting bronchitis again by:  Washing your hands often.  Avoiding people with cold symptoms.  Trying not to touch your hands to your mouth, nose, or eyes.  Follow up with your doctor as told. GET HELP IF: Your symptoms do not improve after 1 week of treatment. Symptoms include:  Cough.  Fever.  Coughing up thick spit.  Body aches.  Chest congestion.  Chills.  Shortness of breath.  Sore throat. GET HELP RIGHT AWAY IF:   You have an increased fever.  You have chills.  You have severe shortness of breath.  You have bloody thick spit (sputum).  You throw up (vomit) often.  You lose too much body fluid (dehydration).  You have a severe headache.  You faint. MAKE SURE YOU:   Understand these instructions.  Will watch your condition.  Will get help right away if you are not doing well or get worse.   This information is not intended to replace advice given to you by your health care provider. Make sure you discuss any questions you have with your health care  provider. Acute Bronchitis Bronchitis is when the airways that extend from the windpipe into the lungs get red, puffy, and painful (inflamed). Bronchitis often causes thick spit (mucus) to develop. This leads to a cough. A cough is the most common symptom of bronchitis. In acute bronchitis, the condition usually begins suddenly and goes away over time (usually in 2 weeks). Smoking, allergies, and asthma can make bronchitis worse. Repeated episodes of bronchitis may cause more lung problems. HOME CARE  Rest.  Drink enough fluids to keep your pee (urine) clear or pale yellow (unless you need to limit fluids as told by your doctor).  Only take over-the-counter or prescription medicines as told by your doctor.  Avoid smoking and secondhand smoke. These can make bronchitis worse. If you are a smoker, think about using nicotine gum or skin patches. Quitting smoking will help your lungs heal faster.  Reduce the chance of getting bronchitis again by:  Washing your hands often.  Avoiding people with cold symptoms.  Trying not to touch your hands to your mouth, nose, or eyes.  Follow up with your doctor as told. GET HELP IF: Your symptoms do not improve after 1 week of treatment. Symptoms include:  Cough.  Fever.  Coughing up thick spit.  Body aches.  Chest congestion.  Chills.  Shortness of breath.  Sore throat. GET HELP RIGHT AWAY IF:   You have an increased fever.  You have chills.  You have severe shortness of breath.  You have bloody thick spit (  sputum).  You throw up (vomit) often.  You lose too much body fluid (dehydration).  You have a severe headache.  You faint. MAKE SURE YOU:   Understand these instructions.  Will watch your condition.  Will get help right away if you are not doing well or get worse.   This information is not intended to replace advice given to you by your health care provider. Make sure you discuss any questions you have with your  health care provider. Keep an eye on your blood sugar as prednisone may make it go up. Take meds as directed. Follow up with PCP for general medical issues, recheck BP as it was elevated in Er. Return to ER for new or worsening issues.      Document Released: 09/23/2007 Document Revised: 12/07/2012 Document Reviewed: 09/27/2012 Elsevier Interactive Patient Education 2016 [employer] Released: 09/23/2007 Document Revised: 12/07/2012 Document Reviewed: 09/27/2012 Elsevier Interactive Patient Education [employer].

## 2015-10-16 ENCOUNTER — Encounter: Payer: Self-pay | Admitting: Emergency Medicine

## 2015-10-16 ENCOUNTER — Ambulatory Visit
Admission: EM | Admit: 2015-10-16 | Discharge: 2015-10-16 | Disposition: A | Discharge status: Home / Self Care | Payer: Medicare Other | Attending: Family Medicine | Admitting: Family Medicine

## 2015-10-16 DIAGNOSIS — M10072 Idiopathic gout, left ankle and foot: Secondary | ICD-10-CM

## 2015-10-16 DIAGNOSIS — M109 Gout, unspecified: Secondary | ICD-10-CM

## 2015-10-16 DIAGNOSIS — M79672 Pain in left foot: Secondary | ICD-10-CM | POA: Diagnosis not present

## 2015-10-16 MED ORDER — OXYCODONE-ACETAMINOPHEN 5-325 MG PO TABS: 1.0000 | ORAL_TABLET | Freq: Three times a day (TID) | ORAL | Status: DC | PRN

## 2015-10-16 MED ORDER — ONDANSETRON 4 MG PO TBDP: 4.0000 mg | ORAL_TABLET | Freq: Three times a day (TID) | ORAL | Status: DC | PRN

## 2015-10-16 MED ORDER — INDOMETHACIN 25 MG PO CAPS: 25.0000 mg | ORAL_CAPSULE | Freq: Three times a day (TID) | ORAL | Status: DC | PRN

## 2015-10-16 NOTE — Discharge Instructions (Signed)
Take medication as prescribed. Rest. Elevate leg.   Follow up with your primary care physician this week. Return to Urgent care for new or worsening concerns.    Gout Gout is an inflammatory arthritis caused by a buildup of uric acid crystals in the joints. Uric acid is a chemical that is normally present in the blood. When the level of uric acid in the blood is too high it can form crystals that deposit in your joints and tissues. This causes joint redness, soreness, and swelling (inflammation). Repeat attacks are common. Over time, uric acid crystals can form into masses (tophi) near a joint, destroying bone and causing disfigurement. Gout is treatable and often preventable. CAUSES  The disease begins with elevated levels of uric acid in the blood. Uric acid is produced by your body when it breaks down a naturally found substance called purines. Certain foods you eat, such as meats and fish, contain high amounts of purines. Causes of an elevated uric acid level include:  Being passed down from parent to child (heredity).  Diseases that cause increased uric acid production (such as obesity, psoriasis, and certain cancers).  Excessive alcohol use.  Diet, especially diets rich in meat and seafood.  Medicines, including certain cancer-fighting medicines (chemotherapy), water pills (diuretics), and aspirin.  Chronic kidney disease. The kidneys are no longer able to remove uric acid well.  Problems with metabolism. Conditions strongly associated with gout include:  Obesity.  High blood pressure.  High cholesterol.  Diabetes. Not everyone with elevated uric acid levels gets gout. It is not understood why some people get gout and others do not. Surgery, joint injury, and eating too much of certain foods are some of the factors that can lead to gout attacks. SYMPTOMS   An attack of gout comes on quickly. It causes intense pain with redness, swelling, and warmth in a joint.  Fever can  occur.  Often, only one joint is involved. Certain joints are more commonly involved:  Base of the big toe.  Knee.  Ankle.  Wrist.  Finger. Without treatment, an attack usually goes away in a few days to weeks. Between attacks, you usually will not have symptoms, which is different from many other forms of arthritis. DIAGNOSIS  Your caregiver will suspect gout based on your symptoms and exam. In some cases, tests may be recommended. The tests may include:  Blood tests.  Urine tests.  X-rays.  Joint fluid exam. This exam requires a needle to remove fluid from the joint (arthrocentesis). Using a microscope, gout is confirmed when uric acid crystals are seen in the joint fluid. TREATMENT  There are two phases to gout treatment: treating the sudden onset (acute) attack and preventing attacks (prophylaxis).  Treatment of an Acute Attack.  Medicines are used. These include anti-inflammatory medicines or steroid medicines.  An injection of steroid medicine into the affected joint is sometimes necessary.  The painful joint is rested. Movement can worsen the arthritis.  You may use warm or cold treatments on painful joints, depending which works best for you.  Treatment to Prevent Attacks.  If you suffer from frequent gout attacks, your caregiver may advise preventive medicine. These medicines are started after the acute attack subsides. These medicines either help your kidneys eliminate uric acid from your body or decrease your uric acid production. You may need to stay on these medicines for a very long time.  The early phase of treatment with preventive medicine can be associated with an increase in acute  gout attacks. For this reason, during the first few months of treatment, your caregiver may also advise you to take medicines usually used for acute gout treatment. Be sure you understand your caregiver's directions. Your caregiver may make several adjustments to your medicine  dose before these medicines are effective.  Discuss dietary treatment with your caregiver or dietitian. Alcohol and drinks high in sugar and fructose and foods such as meat, poultry, and seafood can increase uric acid levels. Your caregiver or dietitian can advise you on drinks and foods that should be limited. HOME CARE INSTRUCTIONS   Do not take aspirin to relieve pain. This raises uric acid levels.  Only take over-the-counter or prescription medicines for pain, discomfort, or fever as directed by your caregiver.  Rest the joint as much as possible. When in bed, keep sheets and blankets off painful areas.  Keep the affected joint raised (elevated).  Apply warm or cold treatments to painful joints. Use of warm or cold treatments depends on which works best for you.  Use crutches if the painful joint is in your leg.  Drink enough fluids to keep your urine clear or pale yellow. This helps your body get rid of uric acid. Limit alcohol, sugary drinks, and fructose drinks.  Follow your dietary instructions. Pay careful attention to the amount of protein you eat. Your daily diet should emphasize fruits, vegetables, whole grains, and fat-free or low-fat milk products. Discuss the use of coffee, vitamin C, and cherries with your caregiver or dietitian. These may be helpful in lowering uric acid levels.  Maintain a healthy body weight. SEEK MEDICAL CARE IF:   You develop diarrhea, vomiting, or any side effects from medicines.  You do not feel better in 24 hours, or you are getting worse. SEEK IMMEDIATE MEDICAL CARE IF:   Your joint becomes suddenly more tender, and you have chills or a fever. MAKE SURE YOU:   Understand these instructions.  Will watch your condition.  Will get help right away if you are not doing well or get worse.   This information is not intended to replace advice given to you by your health care provider. Make sure you discuss any questions you have with your health  care provider.   Document Released: 04/03/2000 Document Revised: 04/27/2014 Document Reviewed: 11/18/2011 Elsevier Interactive Patient Education [employer].

## 2015-10-16 NOTE — ED Notes (Signed)
Patient c/o left foot pain and swelling that started Monday.  Patient denies injury.

## 2015-10-16 NOTE — ED Provider Notes (Signed)
[facility]  ____________________________________________  Time seen: Approximately 9:10 AM  I have reviewed the triage vital signs and the nursing notes.   HISTORY  Chief Complaint Foot Pain  HPI Brenda Lin is a 65 y.o. female presents with a complaint of left foot pain since Monday morning. Patient reports that she has a history of similar pain with her gout in the past. Patient denies trauma or injury. Patient states that Monday in the morning when she woke up the pain was present to her left lateral foot and ankle. Patient states that yesterday she had more pain as well as more swelling. States tender to light touch. Patient states that she has been elevating it and resting left foot. Patient reports that she knows this is her gout as it feels the same as her gout in the past. Denies any pain radiation. Denies any numbness or tingling sensations. Patient again denies any fall or trauma. Patient reports that she ate more meat and processed meat on Sunday and Monday she felt like this contributed to her pain.  Patient reports that yesterday she did have a few episodes of diarrhea and felt slightly nauseous, but denies abdominal pain. Patient states that she feels like this is because of recent stress as her sister passed away and has others visiting in town. Denies any abnormal colored stool, blood in stool, blood in toilet or black stool. Denies vomiting. Denies chest pain or shortness of breath, abdominal pain, dysuria, weakness, dizziness, neck pain, back pain, extremity swelling or pain other than as above. Patient reports that she is a diabetic controlled by oral metformin. Patient states she checks her blood sugar every other day and is usually in the low 100s.  PCP: [facility]   Past Medical History  Diagnosis Date  . Hypertension   . Diabetes mellitus without complication (HCC)   . Asthma   . Diverticulitis 2013  . Acid reflux     Patient Active Problem List    Diagnosis Date Noted  . Nipple discharge 10/03/2014    Past Surgical History  Procedure Laterality Date  . Hernia repair  2013  . Appendectomy    . Shoulder arthroscopy w/ rotator cuff repair Left 2016  . Colonoscopy  2015    Dr Ifitkhar  . Colon resection  2013    Dr Bird    Current Outpatient Rx  Name  Route  Sig  Dispense  Refill  . ACCU-CHEK AVIVA PLUS test strip                 Dispense as written.   . albuterol (PROVENTIL HFA;VENTOLIN HFA) 108 (90 Base) MCG/ACT inhaler   Inhalation   Inhale 2 puffs into the lungs every 4 (four) hours as needed for wheezing or shortness of breath.   1 Inhaler   0   . amLODipine (NORVASC) 5 MG tablet      TK 1 T PO QD      11    . aspirin 325 MG EC tablet   Oral   Take 325 mg by mouth daily.         . fluticasone (FLONASE) 50 MCG/ACT nasal spray      as needed.          .           . metFORMIN (GLUCOPHAGE-XR) 500 MG 24 hr tablet      TK 1 T PO QD      0   . mupirocin ointment (  BACTROBAN) 2 %      APPLY TOPICALLY TO AFFECTED AREA BID FOR 11 DAYS      0   . omeprazole (PRILOSEC) 40 MG capsule   Oral   Take 40 mg by mouth daily.         .           .           . promethazine (PHENERGAN) 25 MG tablet      TK 1 T PO Q 6 H      0   . spironolactone-hydrochlorothiazide (ALDACTAZIDE) 25-25 MG per tablet   Oral   Take 1 tablet by mouth daily.            Allergies Vicodin  Family History  Problem Relation Age of Onset  . Cancer Maternal Grandmother     breast  . Cancer Sister     breast age 77"s  . Cancer Maternal Aunt     breast    Social History Social History  Substance Use Topics  . Smoking status: Former Smoker    Quit date: 04/21/2011  . Smokeless tobacco: Never Used  . Alcohol Use: No    Review of Systems Constitutional: No fever/chills Eyes: No visual changes. ENT: No sore throat. Cardiovascular: Denies chest pain. Respiratory: Denies shortness of  breath. Gastrointestinal: No abdominal pain.  No nausea, no vomiting.  No diarrhea.  No constipation. Genitourinary: Negative for dysuria.  Musculoskeletal: Negative for back pain. Left foot pain as above. Skin: Negative for rash. Neurological: Negative for headaches, focal weakness or numbness.  10-point ROS otherwise negative.  ____________________________________________   PHYSICAL EXAM:  VITAL SIGNS: ED Triage Vitals  Enc Vitals Group     BP 10/16/15 0849 160/85 mmHg     Pulse Rate 10/16/15 0849 94     Resp 10/16/15 0849 16     Temp 10/16/15 0849 98 F (36.7 C)     Temp Source 10/16/15 0849 Oral     SpO2 10/16/15 0849 95 %     Weight 10/16/15 0849 195 lb (88.451 kg)     Height 10/16/15 0849 5\' 5"  (1.651 m)     Head Cir --      Peak Flow --      Pain Score 10/16/15 0848 8     Pain Loc --      Pain Edu? --      Excl. in [place]? --     Constitutional: Alert and oriented. Well appearing and in no acute distress. Eyes: Conjunctivae are normal. PERRL. EOMI. Head: Atraumatic.  Ears: Normal external appearance bilaterally.  Nose: No congestion/rhinnorhea.  Mouth/Throat: Mucous membranes are moist.   Neck: No stridor.  No cervical spine tenderness to palpation. Cardiovascular: Normal rate, regular rhythm. Grossly normal heart sounds.  Good peripheral circulation. Respiratory: Normal respiratory effort.  No retractions. Lungs CTAB. No wheezes, rales or rhonchi. Gastrointestinal: Soft and nontender. No distention. Normal Bowel sounds.   No CVA tenderness. Musculoskeletal: No lower or upper extremity tenderness nor edema. Bilateral pedal pulses equal and easily palpated.  Except: Left lateral foot mild tenderness to palpation and with light touch, minimal swelling, no ecchymosis, no erythema, full range of motion, no bony tenderness, skin intact, bilateral pedal pulses equal and easily palpated. Ambulatory with steady gait. Neurologic:  Normal speech and language. No gross focal  neurologic deficits are appreciated. No gait instability. Skin:  Skin is warm, dry and intact. No rash noted. Psychiatric: Mood and affect are normal.  Speech and behavior are normal.  ____________________________________________   LABS (all labs ordered are listed, but only abnormal results are displayed)  Labs Reviewed - No data to display   INITIAL IMPRESSION / [facility] / ED COURSE  Pertinent labs & imaging results that were available during my care of the patient were reviewed by me and considered in my medical decision making (see chart for details).  Well-appearing patient. No acute distress. Patient smiling and laughing in room. Presents for the complaints of left lateral foot pain 2 days which is consistent per patient with her gout in the past. Denies fall or trauma. Patient also reports some nausea and intermittent diarrhea yesterday which she feels is from recent stress which is consistent with her past history. Denies abdominal pain. Suspect acute left gouty pain. As with recent diarrhea will avoid colchicine at this time, patient reports she usually has diarrhea with colchicine. Will treat patient with oral indomethacin, when necessary Percocet as needed for breakthrough pain as well as oral Zofran supportively. Encouraged rest, elevation, fluids and PCP follow-up.  Discussed follow up with Primary care physician this week. Discussed follow up and return parameters including no resolution or any worsening concerns. Patient verbalized understanding and agreed to plan.   ____________________________________________   FINAL CLINICAL IMPRESSION(S) / ED DIAGNOSES  Final diagnoses:  Left foot pain  Acute gout of left ankle, unspecified cause     Discharge Medication List as of 10/16/2015  9:17 AM    START taking these medications   Details  indomethacin (INDOCIN) 25 MG capsule Take 1 capsule (25 mg total) by mouth 3 (three) times daily as needed., Starting  10/16/2015, Until Discontinued, Normal    ondansetron (ZOFRAN ODT) 4 MG disintegrating tablet Take 1 tablet (4 mg total) by mouth every 8 (eight) hours as needed for nausea or vomiting., Starting 10/16/2015, Until Discontinued, Normal    oxyCODONE-acetaminophen (ROXICET) 5-325 MG tablet Take 1 tablet by mouth every 8 (eight) hours as needed for moderate pain or severe pain (Do not drive or operate heavy machinery while taking as can cause drowsiness.)., Starting 10/16/2015, Until Discontinued, Print        Note: This dictation was prepared with Dragon dictation along with smaller phrase technology. Any transcriptional errors that result from this process are unintentional.       Marylene Land, NP 10/16/15 [id number]

## 2015-11-12 ENCOUNTER — Ambulatory Visit
Admission: EM | Admit: 2015-11-12 | Discharge: 2015-11-12 | Disposition: A | Discharge status: Home / Self Care | Payer: Medicare Other | Attending: Family Medicine | Admitting: Family Medicine

## 2015-11-12 DIAGNOSIS — J0100 Acute maxillary sinusitis, unspecified: Secondary | ICD-10-CM

## 2015-11-12 DIAGNOSIS — S40861A Insect bite (nonvenomous) of right upper arm, initial encounter: Secondary | ICD-10-CM

## 2015-11-12 DIAGNOSIS — W57XXXA Bitten or stung by nonvenomous insect and other nonvenomous arthropods, initial encounter: Secondary | ICD-10-CM

## 2015-11-12 MED ORDER — PREDNISONE 10 MG PO TABS: ORAL_TABLET | ORAL | 0 refills | Status: DC

## 2015-11-12 MED ORDER — AMOXICILLIN-POT CLAVULANATE 875-125 MG PO TABS: 1.0000 | ORAL_TABLET | Freq: Two times a day (BID) | ORAL | 0 refills | Status: DC

## 2015-11-12 NOTE — Discharge Instructions (Signed)
Take medication as prescribed. Rest. Drink plenty of fluids. Monitor blood sugar as discussed. Try to not scratch.   Follow up with your primary care physician this week as needed. Return to Urgent care for new or worsening concerns.

## 2015-11-12 NOTE — ED Triage Notes (Signed)
Patient presents with a bee sting to her right arm that is swollen and red, also warm to the touch. She was stung around 6:00p yesterday.

## 2015-11-12 NOTE — ED Provider Notes (Signed)
[facility] ____________________________________________  Time seen: Approximately 7:59 PM  I have reviewed the triage vital signs and the nursing notes.   HISTORY  Chief Complaint Insect Bite (right arm)   HPI Brenda Lin is a 65 y.o. female presents with complaint of right arm redness and swelling. Patient reports last night approximately 7 PM she was outside sweeping her front porch and states that he had a wasp sting her. Patient states that she turned when she felt a sting to her right arm, and then saw a wasp fly away from her arm. Patient reports that she had immediate redness and swelling in the same location. Denies any history of allergic reactions to insect bites or stings in the past. Patient states that the area has continued with redness and swelling throughout the day but denies any worsening. States that the area is itchy and mildly tender. Denies any decreased range of motion. Denies any numbness or tingling sensation. Denies any chest pain, shortness of breath, facial swelling, mouth swelling, difficulty swallowing or any wheezing. Patient reports she is not taking any medication for the same complaint.  Denies any other triggers. Denies any changes in foods, medicines, lotions, detergents or other contacts.  Patient also complains of 1 week of sinus congestion and sinus drainage. Patient reports history of some seasonal allergies and reports that she feels that the weather is currently aggravating her allergies causing her sinuses to have congestion. Reports that she has some postnasal drainage as well as some nasal drainage. Patient reports at this point her sinuses still feel clogged and aching. Patient denies any fevers. Reports continues to eat and drink well. Denies taking any medications over-the-counter for the same complaints. Denies any known sick contacts.  Patient again denies any chest pain, shortness of breath, abdominal pain, dysuria, neck pain,  back pain, dizziness, vision changes, decreased oral intake, facial or oropharyngeal swelling.  PCP: Caprice Renshaw, MD    Past Medical History:  Diagnosis Date  . Acid reflux   . Asthma   . Diabetes mellitus without complication ([place])   . Diverticulitis 2013  . Hypertension     Patient Active Problem List   Diagnosis Date Noted  . Nipple discharge 10/03/2014    Past Surgical History:  Procedure Laterality Date  . APPENDECTOMY    . COLON RESECTION  2013   Dr Marina Gravel  . COLONOSCOPY  2015   Dr Ernst Breach  . HERNIA REPAIR  2013  . SHOULDER ARTHROSCOPY W/ ROTATOR CUFF REPAIR Left 2016   No current facility-administered medications for this encounter.   Current Outpatient Prescriptions:  .  ACCU-CHEK AVIVA PLUS test strip, , Disp: , Rfl:  .  albuterol (PROVENTIL HFA;VENTOLIN HFA) 108 (90 Base) MCG/ACT inhaler, Inhale 2 puffs into the lungs every 4 (four) hours as needed for wheezing or shortness of breath., Disp: 1 Inhaler, Rfl: 0 .  amLODipine (NORVASC) 5 MG tablet, TK 1 T PO QD, Disp: , Rfl: 11 .  aspirin 325 MG EC tablet, Take 325 mg by mouth daily., Disp: , Rfl:  .  fluticasone (FLONASE) 50 MCG/ACT nasal spray, as needed. , Disp: , Rfl:  .  metFORMIN (GLUCOPHAGE-XR) 500 MG 24 hr tablet, TK 1 T PO QD, Disp: , Rfl: 0 .  ondansetron (ZOFRAN ODT) 4 MG disintegrating tablet, Take 1 tablet (4 mg total) by mouth every 8 (eight) hours as needed for nausea or vomiting., Disp: 15 tablet, Rfl: 0 .  promethazine (PHENERGAN) 25 MG tablet, TK 1  T PO Q 6 H, Disp: , Rfl: 0 .  spironolactone-hydrochlorothiazide (ALDACTAZIDE) 25-25 MG per tablet, Take 1 tablet by mouth daily. , Disp: , Rfl:  .  amoxicillin-clavulanate (AUGMENTIN) 875-125 MG tablet, Take 1 tablet by mouth every 12 (twelve) hours., Disp: 20 tablet, Rfl: 0 .  indomethacin (INDOCIN) 25 MG capsule, Take 1 capsule (25 mg total) by mouth 3 (three) times daily as needed., Disp: 15 capsule, Rfl: 0 .  mupirocin ointment (BACTROBAN) 2 %,  APPLY TOPICALLY TO AFFECTED AREA BID FOR 11 DAYS, Disp: , Rfl: 0 .  omeprazole (PRILOSEC) 40 MG capsule, Take 40 mg by mouth daily., Disp: , Rfl:  .  oxyCODONE-acetaminophen (ROXICET) 5-325 MG tablet, Take 1 tablet by mouth every 8 (eight) hours as needed for moderate pain or severe pain (Do not drive or operate heavy machinery while taking as can cause drowsiness.)., Disp: 9 tablet, Rfl: 0 .  predniSONE (DELTASONE) 10 MG tablet, Start 60 mg po day one, then 50 mg po day two, taper by 10 mg daily until complete., Disp: 21 tablet, Rfl: 0  Allergies Vicodin [hydrocodone-acetaminophen]  Family History  Problem Relation Age of Onset  . Cancer Maternal Grandmother     breast  . Cancer Sister     breast age 39"s  . Cancer Maternal Aunt     breast    Social History Social History  Substance Use Topics  . Smoking status: Former Smoker    Quit date: 04/21/2011  . Smokeless tobacco: Never Used  . Alcohol use No    Review of Systems Constitutional: No fever/chills Eyes: No visual changes. ENT: No sore throat.As above. Cardiovascular: Denies chest pain. Respiratory: Denies shortness of breath. Gastrointestinal: No abdominal pain.  No nausea, no vomiting.  No diarrhea.  No constipation. Genitourinary: Negative for dysuria. Musculoskeletal: Negative for back pain. Skin: positive for rash. Neurological: Negative for headaches, focal weakness or numbness.  10-point ROS otherwise negative.  ____________________________________________   PHYSICAL EXAM:  VITAL SIGNS: ED Triage Vitals  Enc Vitals Group     BP 11/12/15 1938 (!) 147/82     Pulse Rate 11/12/15 1938 83     Resp 11/12/15 1938 18     Temp 11/12/15 1938 98.3 F (36.8 C)     Temp Source 11/12/15 1938 Oral     SpO2 11/12/15 1938 99 %     Weight 11/12/15 1935 200 lb (90.7 kg)     Height 11/12/15 1935 5\' 5"  (1.651 m)     Head Circumference --      Peak Flow --      Pain Score 11/12/15 1938 8     Pain Loc --      Pain  Edu? --      Excl. in [place]? --    Constitutional: Alert and oriented. Well appearing and in no acute distress. Eyes: Conjunctivae are normal. PERRL. EOMI. Head: Atraumatic.Mild to moderate tenderness to palpation bilateral frontal and maxillary sinuses. No swelling. No erythema.   Ears: no erythema, normal TMs bilaterally.   Nose: nasal congestion with bilateral nasal turbinate erythema and edema.   Mouth/Throat: Mucous membranes are moist.  Oropharynx non-erythematous.No tonsillar swelling or exudate. No lip, tongue or oropharyngeal swelling noted. Neck: No stridor.  No cervical spine tenderness to palpation. Hematological/Lymphatic/Immunilogical: No cervical lymphadenopathy. Cardiovascular: Normal rate, regular rhythm. Grossly normal heart sounds.  Good peripheral circulation. Respiratory: Normal respiratory effort.  No retractions. Lungs CTAB. No wheezes, rales or rhonchi. Good air movement.  Gastrointestinal: Soft and  nontender. Obese abdomen No CVA tenderness. Musculoskeletal: No lower or upper extremity tenderness nor edema.  Bilateral pedal pulses equal and easily palpated. No cervical, thoracic or lumbar tenderness to palpation.  Neurologic:  Normal speech and language. No gross focal neurologic deficits are appreciated. No gait instability. Skin:  Skin is warm, dry and intact. No rash noted. Except: Right lateral distal humerus erythematous pruritic rash area with minimal swelling, centered punctum noted, minimal induration and centered punctum, no fluctuance, no drainage, no exudate, no foreign bodies visualized, right elbow full range of motion without difficulty, sensation to right upper extremity intact, normal capillary refill, normal bilateral distal radial pulses. Psychiatric: Mood and affect are normal. Speech and behavior are normal.   PROCEDURES Procedures    INITIAL IMPRESSION / ASSESSMENT AND PLAN / ED COURSE  Pertinent labs & imaging results that were available during  my care of the patient were reviewed by me and considered in my medical decision making (see chart for details).  Very well-appearing patient. Smiling and laughing in room. Presents for multiple medical complaints. Patient reports wasp sting last night while outside sweeping her porch. Suspect local reaction to insect sting. Also with sinus pressure and sinus congestion for the last week. Will treat patient with oral Augmentin, prednisone taper for both complaints. Encouraged patient to take home Benadryl or Claritin to assist with itching and inflammatory reaction at insect bite. Encouraged patient not to scratch and monitor closely. As patient follow primary care physician as needed.Discussed monitoring blood sugar closely with prednisone, and patient reports has taken prednisone in the past and tolerated well.   Discussed follow up with Primary care physician this week. Discussed follow up and return parameters including no resolution or any worsening concerns. Patient verbalized understanding and agreed to plan.   ____________________________________________   FINAL CLINICAL IMPRESSION(S) / ED DIAGNOSES  Final diagnoses:  Insect bite of upper arm with local reaction, right, initial encounter  Acute maxillary sinusitis, recurrence not specified     Discharge Medication List as of 11/12/2015  8:09 PM    START taking these medications   Details  amoxicillin-clavulanate (AUGMENTIN) 875-125 MG tablet Take 1 tablet by mouth every 12 (twelve) hours., Starting Tue 11/12/2015, Normal    predniSONE (DELTASONE) 10 MG tablet Start 60 mg po day one, then 50 mg po day two, taper by 10 mg daily until complete., Normal        Note: This dictation was prepared with Dragon dictation along with smaller phrase technology. Any transcriptional errors that result from this process are unintentional.    Clinical Course      Marylene Land, NP 11/12/15 2028

## 2016-01-15 ENCOUNTER — Other Ambulatory Visit: Payer: Self-pay | Admitting: Physician Assistant

## 2016-01-15 DIAGNOSIS — Z1231 Encounter for screening mammogram for malignant neoplasm of breast: Secondary | ICD-10-CM

## 2016-01-29 ENCOUNTER — Ambulatory Visit: Admission: RE | Admit: 2016-01-29 | Payer: Medicare Other | Source: Ambulatory Visit

## 2016-02-14 ENCOUNTER — Ambulatory Visit
Admission: EM | Admit: 2016-02-14 | Discharge: 2016-02-14 | Discharge status: Absent without leave | Payer: Medicare Other

## 2016-06-30 ENCOUNTER — Ambulatory Visit
Admission: EM | Admit: 2016-06-30 | Discharge: 2016-06-30 | Disposition: A | Discharge status: Home / Self Care | Payer: Medicare Other | Attending: Family Medicine | Admitting: Family Medicine

## 2016-06-30 DIAGNOSIS — M109 Gout, unspecified: Secondary | ICD-10-CM

## 2016-06-30 DIAGNOSIS — J40 Bronchitis, not specified as acute or chronic: Secondary | ICD-10-CM | POA: Diagnosis not present

## 2016-06-30 MED ORDER — BENZONATATE 100 MG PO CAPS
100.0000 mg | ORAL_CAPSULE | Freq: Three times a day (TID) | ORAL | 0 refills | Status: DC | PRN
Start: 2016-06-30 — End: 2016-09-02

## 2016-06-30 MED ORDER — PREDNISONE 10 MG PO TABS: ORAL_TABLET | ORAL | 0 refills | Status: DC

## 2016-06-30 MED ORDER — DOXYCYCLINE HYCLATE 100 MG PO CAPS
100.0000 mg | ORAL_CAPSULE | Freq: Two times a day (BID) | ORAL | 0 refills | Status: DC
Start: 2016-06-30 — End: 2016-09-02

## 2016-06-30 MED ORDER — OXYCODONE-ACETAMINOPHEN 5-325 MG PO TABS: 1.0000 | ORAL_TABLET | Freq: Two times a day (BID) | ORAL | 0 refills | Status: DC | PRN

## 2016-06-30 NOTE — ED Provider Notes (Signed)
[facility] ____________________________________________  Time seen: Approximately 9:01 PM  I have reviewed the triage vital signs and the nursing notes.   HISTORY  Chief Complaint Cough and Toe Pain   HPI Brenda Lin is a 66 y.o. female present for the complaints of redness, nasal congestion and cough is been present the last 3 weeks. Reports nasal congestion and redness has much improved but continues with lingering cough. States cough is a nonproductive hacking cough. Denies wheezing. Does report she has a history of asthma as well as bronchitis. Has inhalers at home, uses daily. Denies fevers. Reports she felt she was having more sinus pain and sinus congestion 2 weeks ago that has now since resolved. States occasional sore throat. Reports continues to eat and drink well.  Also reports the last 2-3 days she has been having left foot second toe pain swelling and redness consistent with her previous gout flareups. States pain mild to moderate currently, tender to light touch. Reports that she does take daily allopurinol. Patient also reports that yesterday and the day before she took colchicine and indomethacin mild improvement in pain. Patient reports pain is consistent with her previous gout flares that usually affects her first or her second toe left foot. Denies any fall, injury or trauma. Denies any break in skin. Denies insect bite. States feels same as previous gout flares.   Denies chest pain, shortness of breath, abdominal pain, dysuria, extremity pain, extremity swelling or rash. Denies recent sickness. Denies recent antibiotic use. Reports otherwise feels well.   Caprice Renshaw, MD: PCP   Past Medical History:  Diagnosis Date  . Acid reflux   . Asthma   . Diabetes mellitus without complication ([place])   . Diverticulitis 2013  . Hypertension     Patient Active Problem List   Diagnosis Date Noted  . Nipple discharge 10/03/2014    Past Surgical  History:  Procedure Laterality Date  . APPENDECTOMY    . COLON RESECTION  2013   Dr Marina Gravel  . COLONOSCOPY  2015   Dr Ernst Breach  . HERNIA REPAIR  2013  . SHOULDER ARTHROSCOPY W/ ROTATOR CUFF REPAIR Left 2016     No current facility-administered medications for this encounter.   Current Outpatient Prescriptions:  .  ACCU-CHEK AVIVA PLUS test strip, , Disp: , Rfl:  .  albuterol (PROVENTIL HFA;VENTOLIN HFA) 108 (90 Base) MCG/ACT inhaler, Inhale 2 puffs into the lungs every 4 (four) hours as needed for wheezing or shortness of breath., Disp: 1 Inhaler, Rfl: 0 .  allopurinol (ZYLOPRIM) 100 MG tablet, Take 100 mg by mouth daily., Disp: , Rfl:  .  amLODipine (NORVASC) 5 MG tablet, TK 1 T PO QD, Disp: , Rfl: 11 .  aspirin 325 MG EC tablet, Take 325 mg by mouth daily., Disp: , Rfl:  .  fluticasone (FLONASE) 50 MCG/ACT nasal spray, as needed. , Disp: , Rfl:  .  metFORMIN (GLUCOPHAGE-XR) 500 MG 24 hr tablet, TK 1 T PO QD, Disp: , Rfl: 0 .  spironolactone (ALDACTONE) 25 MG tablet, Take 25 mg by mouth daily., Disp: , Rfl:  .  amoxicillin-clavulanate (AUGMENTIN) 875-125 MG tablet, Take 1 tablet by mouth every 12 (twelve) hours., Disp: 20 tablet, Rfl: 0 .  benzonatate (TESSALON PERLES) 100 MG capsule, Take 1 capsule (100 mg total) by mouth 3 (three) times daily as needed., Disp: 15 capsule, Rfl: 0 .  doxycycline (VIBRAMYCIN) 100 MG capsule, Take 1 capsule (100 mg total) by mouth 2 (two) times daily.,  Disp: 20 capsule, Rfl: 0 .  indomethacin (INDOCIN) 25 MG capsule, Take 1 capsule (25 mg total) by mouth 3 (three) times daily as needed., Disp: 15 capsule, Rfl: 0 .  mupirocin ointment (BACTROBAN) 2 %, APPLY TOPICALLY TO AFFECTED AREA BID FOR 11 DAYS, Disp: , Rfl: 0 .  omeprazole (PRILOSEC) 40 MG capsule, Take 40 mg by mouth daily., Disp: , Rfl:  .  ondansetron (ZOFRAN ODT) 4 MG disintegrating tablet, Take 1 tablet (4 mg total) by mouth every 8 (eight) hours as needed for nausea or vomiting., Disp: 15 tablet,  Rfl: 0 .  oxyCODONE-acetaminophen (ROXICET) 5-325 MG tablet, Take 1 tablet by mouth 2 (two) times daily as needed for moderate pain or severe pain (Do not drive or operate heavy machinery while taking as can cause drowsiness.)., Disp: 3 tablet, Rfl: 0 .  predniSONE (DELTASONE) 10 MG tablet, Start 60 mg po day one, then 50 mg po day two, taper by 10 mg daily until complete., Disp: 21 tablet, Rfl: 0 .  promethazine (PHENERGAN) 25 MG tablet, TK 1 T PO Q 6 H, Disp: , Rfl: 0 .  spironolactone-hydrochlorothiazide (ALDACTAZIDE) 25-25 MG per tablet, Take 1 tablet by mouth daily. , Disp: , Rfl:   Allergies Vicodin [hydrocodone-acetaminophen]  Family History  Problem Relation Age of Onset  . Cancer Maternal Grandmother     breast  . Cancer Sister     breast age 77"s  . Cancer Maternal Aunt     breast    Social History Social History  Substance Use Topics  . Smoking status: Former Smoker    Quit date: 04/21/2011  . Smokeless tobacco: Never Used  . Alcohol use No    Review of Systems Constitutional: No fever/chills Eyes: No visual changes. ENT: No sore throat. Cardiovascular: Denies chest pain. Respiratory: Denies shortness of breath. Gastrointestinal: No abdominal pain.  No nausea, no vomiting.  No diarrhea.  No constipation. Genitourinary: Negative for dysuria. Musculoskeletal: Negative for back pain. As above.  Skin: Negative for rash. Neurological: Negative for focal weakness or numbness.  10-point ROS otherwise negative.  ____________________________________________   PHYSICAL EXAM:  VITAL SIGNS: ED Triage Vitals  Enc Vitals Group     BP 06/30/16 1818 (!) 156/86     Pulse Rate 06/30/16 1818 90     Resp 06/30/16 1818 18     Temp 06/30/16 1818 98 F (36.7 C)     Temp Source 06/30/16 1818 Oral     SpO2 06/30/16 1818 97 %     Weight 06/30/16 1815 195 lb (88.5 kg)     Height 06/30/16 1815 5\' 5"  (1.651 m)     Head Circumference --      Peak Flow --      Pain Score  06/30/16 1935 5     Pain Loc --      Pain Edu? --      Excl. in [place]? --     Constitutional: Alert and oriented. Well appearing and in no acute distress. Eyes: Conjunctivae are normal. PERRL. EOMI. Head: Atraumatic. No sinus tenderness to palpation. No swelling. No erythema.  Ears: no erythema, normal TMs bilaterally.   Nose:Nasal congestion with clear rhinorrhea  Mouth/Throat: Mucous membranes are moist. Mild pharyngeal erythema. No tonsillar swelling or exudate.  Neck: No stridor.  No cervical spine tenderness to palpation. Hematological/Lymphatic/Immunilogical: No cervical lymphadenopathy. Cardiovascular: Normal rate, regular rhythm. Grossly normal heart sounds.  Good peripheral circulation. Respiratory: Normal respiratory effort.  No retractions. No wheezes, rales or  rhonchi. Good air movement.  Gastrointestinal: Soft and nontender. Normal Bowel sounds. No CVA tenderness. Musculoskeletal: Ambulatory with steady gait. No cervical, thoracic or lumbar tenderness to palpation. Except: Left foot second toe mild diffuse swelling and erythema with mild swelling and erythema present at the dorsal proximal left foot, no break in skin noted, no induration, no fluctuance, no drainage, full range of motion, normal distal sensation and capillary refill, left foot otherwise nontender. Neurologic:  Normal speech and language. No gait instability. Skin:  Skin appears warm, dry and intact. No rash noted. Psychiatric: Mood and affect are normal. Speech and behavior are normal.  ___________________________________________   LABS (all labs ordered are listed, but only abnormal results are displayed)  Labs Reviewed - No data to display ____________________________________________   PROCEDURES Procedures    INITIAL IMPRESSION / ASSESSMENT AND PLAN / ED COURSE  Pertinent labs & imaging results that were available during my care of the patient were reviewed by me and considered in my medical decision  making (see chart for details).  Well-appearing patient. No acute distress. Denies injury, declines xray. Suspect bronchitis, as well as gout flareup. Will treat patient with oral prednisone taper, oral doxycycline, and when necessary Tessalon Perles in quantity #3 Percocet given. Encouraged rest, fluids and supportive care. Monitoring symptoms for improvement. Also discussed monitoring for signs of skin infection in detail with patient.Discussed indication, risks and benefits of medications with patient.   [place] controlled substance database reviewed from last year, last controlled substance documented 10/16/2015 quantity 9 Percocet tablets.   Discussed follow up with Primary care physician this week. Discussed follow up and return parameters including no resolution or any worsening concerns. Patient verbalized understanding and agreed to plan.   ____________________________________________   FINAL CLINICAL IMPRESSION(S) / ED DIAGNOSES  Final diagnoses:  Bronchitis  Acute gout involving toe of left foot, unspecified cause     Discharge Medication List as of 06/30/2016  7:31 PM    START taking these medications   Details  benzonatate (TESSALON PERLES) 100 MG capsule Take 1 capsule (100 mg total) by mouth 3 (three) times daily as needed., Starting Tue 06/30/2016, Normal    doxycycline (VIBRAMYCIN) 100 MG capsule Take 1 capsule (100 mg total) by mouth 2 (two) times daily., Starting Tue 06/30/2016, Normal        Note: This dictation was prepared with Dragon dictation along with smaller phrase technology. Any transcriptional errors that result from this process are unintentional.         Marylene Land, NP 06/30/16 2133

## 2016-06-30 NOTE — ED Triage Notes (Signed)
Patient complains of cough and congestion x 3 weeks. Patient also complains of a gout attack x 2 days in her left 2nd toe with redness and pain.

## 2016-06-30 NOTE — Discharge Instructions (Signed)
Take medication as prescribed. Rest. Drink plenty of fluids.  ° °Follow up with your primary care physician this week as needed. Return to Urgent care for new or worsening concerns.  ° °

## 2016-09-02 ENCOUNTER — Encounter: Payer: Self-pay | Admitting: *Deleted

## 2016-09-02 ENCOUNTER — Ambulatory Visit
Admission: EM | Admit: 2016-09-02 | Discharge: 2016-09-02 | Disposition: A | Discharge status: Home / Self Care | Payer: Medicare Other | Attending: Family Medicine | Admitting: Family Medicine

## 2016-09-02 DIAGNOSIS — J309 Allergic rhinitis, unspecified: Secondary | ICD-10-CM | POA: Diagnosis not present

## 2016-09-02 DIAGNOSIS — W57XXXA Bitten or stung by nonvenomous insect and other nonvenomous arthropods, initial encounter: Secondary | ICD-10-CM

## 2016-09-02 DIAGNOSIS — S60561A Insect bite (nonvenomous) of right hand, initial encounter: Secondary | ICD-10-CM

## 2016-09-02 DIAGNOSIS — J020 Streptococcal pharyngitis: Secondary | ICD-10-CM | POA: Diagnosis not present

## 2016-09-02 DIAGNOSIS — H6691 Otitis media, unspecified, right ear: Secondary | ICD-10-CM | POA: Diagnosis not present

## 2016-09-02 LAB — RAPID STREP SCREEN (MED CTR MEBANE ONLY): STREPTOCOCCUS, GROUP A SCREEN (DIRECT): POSITIVE — AB

## 2016-09-02 MED ORDER — BENZONATATE 100 MG PO CAPS
100.0000 mg | ORAL_CAPSULE | Freq: Three times a day (TID) | ORAL | 0 refills | Status: DC | PRN
Start: 2016-09-02 — End: 2017-01-14

## 2016-09-02 MED ORDER — LORATADINE 10 MG PO TABS: 10.0000 mg | ORAL_TABLET | Freq: Every day | ORAL | 0 refills | Status: DC

## 2016-09-02 MED ORDER — AMOXICILLIN 875 MG PO TABS: 875.0000 mg | ORAL_TABLET | Freq: Two times a day (BID) | ORAL | 0 refills | Status: DC

## 2016-09-02 NOTE — ED Provider Notes (Signed)
[facility] ____________________________________________  Time seen: Approximately 3:54 PM  I have reviewed the triage vital signs and the nursing notes.   HISTORY  Chief Complaint Sore Throat; Otalgia; Cough; Fever; and Insect Bite  HPI Brenda Lin is a 66 y.o. female presenting for evaluation of some nasal congestion, sore throat, right ear pain and some cough. Patient reports clear mucus from coughing and nasal drainage. States cough is somewhat improved. States sore throat has continued and is currently described as mild and mostly with active swallowing. States mild right ear complaints at this time. Denies known sick contacts but recently around her granddaughter. Denies any fevers. Reports has continued to eat and drink well. Denies any wheezing. Patient reports that she did get bit by an insect this past weekend and did note there is a correlation between your current symptoms of insect bite. Denies tick bite or tick attachment and denies pain, redness or rash at insect bite site.  Denies chest pain, shortness of breath, abdominal pain, dysuria, extremity pain, extremity swelling or rash. Denies recent sickness. Reports last antibiotic use was doxycycline approximate 2 months ago.  Caprice Renshaw, MD: PCP   Past Medical History:  Diagnosis Date  . Acid reflux   . Asthma   . Diabetes mellitus without complication ([place])   . Diverticulitis 2013  . Hypertension     Patient Active Problem List   Diagnosis Date Noted  . Nipple discharge 10/03/2014    Past Surgical History:  Procedure Laterality Date  . APPENDECTOMY    . COLON RESECTION  2013   Dr Marina Gravel  . COLONOSCOPY  2015   Dr Ernst Breach  . HERNIA REPAIR  2013  . SHOULDER ARTHROSCOPY W/ ROTATOR CUFF REPAIR Left 2016     No current facility-administered medications for this encounter.   Current Outpatient Prescriptions:  .  ACCU-CHEK AVIVA PLUS test strip, , Disp: , Rfl:  .  albuterol (PROVENTIL  HFA;VENTOLIN HFA) 108 (90 Base) MCG/ACT inhaler, Inhale 2 puffs into the lungs every 4 (four) hours as needed for wheezing or shortness of breath., Disp: 1 Inhaler, Rfl: 0 .  allopurinol (ZYLOPRIM) 100 MG tablet, Take 100 mg by mouth daily., Disp: , Rfl:  .  aspirin 325 MG EC tablet, Take 325 mg by mouth daily., Disp: , Rfl:  .  fluticasone (FLONASE) 50 MCG/ACT nasal spray, as needed. , Disp: , Rfl:  .  metFORMIN (GLUCOPHAGE-XR) 500 MG 24 hr tablet, TK 1 T PO QD, Disp: , Rfl: 0 .  mupirocin ointment (BACTROBAN) 2 %, APPLY TOPICALLY TO AFFECTED AREA BID FOR 11 DAYS, Disp: , Rfl: 0 .  spironolactone (ALDACTONE) 25 MG tablet, Take 25 mg by mouth daily., Disp: , Rfl:  .  amoxicillin (AMOXIL) 875 MG tablet, Take 1 tablet (875 mg total) by mouth 2 (two) times daily., Disp: 20 tablet, Rfl: 0 .  benzonatate (TESSALON PERLES) 100 MG capsule, Take 1 capsule (100 mg total) by mouth 3 (three) times daily as needed for cough., Disp: 15 capsule, Rfl: 0 .  loratadine (CLARITIN) 10 MG tablet, Take 1 tablet (10 mg total) by mouth daily., Disp: 10 tablet, Rfl: 0 .  omeprazole (PRILOSEC) 40 MG capsule, Take 40 mg by mouth daily., Disp: , Rfl:   Allergies Vicodin [hydrocodone-acetaminophen]  Family History  Problem Relation Age of Onset  . Cancer Maternal Grandmother        breast  . Cancer Sister        breast age 29"s  .  Cancer Maternal Aunt        breast    Social History Social History  Substance Use Topics  . Smoking status: Former Smoker    Quit date: 04/21/2011  . Smokeless tobacco: Never Used  . Alcohol use No    Review of Systems Constitutional: As above Eyes: No visual changes. ENT: Positive sore throat. Cardiovascular: Denies chest pain. Respiratory: Denies shortness of breath. Gastrointestinal: No abdominal pain.  No nausea, no vomiting.  No diarrhea.  No constipation. Genitourinary: Negative for dysuria. Musculoskeletal: Negative for back pain. Skin: Negative for  rash.   ____________________________________________   PHYSICAL EXAM:  VITAL SIGNS: ED Triage Vitals  Enc Vitals Group     BP 09/02/16 1542 (!) 142/87     Pulse Rate 09/02/16 1542 85     Resp 09/02/16 1542 16     Temp 09/02/16 1542 98.4 F (36.9 C)     Temp Source 09/02/16 1542 Oral     SpO2 09/02/16 1542 95 %     Weight 09/02/16 1544 190 lb (86.2 kg)     Height 09/02/16 1544 5\' 5"  (1.651 m)     Head Circumference --      Peak Flow --      Pain Score --      Pain Loc --      Pain Edu? --      Excl. in [place]? --     Constitutional: Alert and oriented. Well appearing and in no acute distress. Eyes: Conjunctivae are normal. PERRL. EOMI. Head: Atraumatic. No sinus tenderness to palpation. No swelling. No erythema.  Ears: Right: Nontender, moderate erythema, dullness of TM. Left nontender, no erythema, normal TM. No surrounding tenderness, swelling or erythema bilaterally.  Nose:Nasal congestion with clear rhinorrhea  Mouth/Throat: Mucous membranes are moist. Mild to moderate pharyngeal erythema. No tonsillar swelling or exudate.  Neck: No stridor.  No cervical spine tenderness to palpation. Hematological/Lymphatic/Immunilogical: Mild anterior bilateral cervical lymphadenopathy. Cardiovascular: Normal rate, regular rhythm. Grossly normal heart sounds.  Good peripheral circulation. Respiratory: Normal respiratory effort.  No retractions. No wheezes, rales or rhonchi. Good air movement.  Gastrointestinal: Soft and nontender. . Musculoskeletal: Ambulatory with steady gait. No cervical, thoracic or lumbar tenderness to palpation. Neurologic:  Normal speech and language. No gait instability. Skin:  Skin appears warm, dry and intact. No rash noted.  Except: Right lateral hand superficial abrasion and per patient location of insect bite, no erythema, no fluctuance, no induration, nontender, no other skin changes noted. Psychiatric: Mood and affect are normal. Speech and behavior are  normal. ___________________________________________   LABS (all labs ordered are listed, but only abnormal results are displayed)  Labs Reviewed  RAPID STREP SCREEN (NOT AT [facility]) - Abnormal; Notable for the following:       Result Value   Streptococcus, Group A Screen (Direct) POSITIVE (*)    All other components within normal limits     PROCEDURES Procedures    INITIAL IMPRESSION / ASSESSMENT AND PLAN / ED COURSE  Pertinent labs & imaging results that were available during my care of the patient were reviewed by me and considered in my medical decision making (see chart for details).  Well-appearing patient. No acute distress. Patient does reports he has some seasonal allergies and has not been taking anything. Suspect allergic rhinitis, right otitis media and quick strep performed was positive. Will treat patient with oral amoxicillin,. Tessalon Perles and as needed Claritin. Discussed monitoring insect bite and wound care.Discussed indication, risks and benefits  of medications with patient.  Discussed follow up with Primary care physician this week. Discussed follow up and return parameters including no resolution or any worsening concerns. Patient verbalized understanding and agreed to plan.   ____________________________________________   FINAL CLINICAL IMPRESSION(S) / ED DIAGNOSES  Final diagnoses:  Strep pharyngitis  Right otitis media, unspecified otitis media type  Allergic rhinitis, unspecified seasonality, unspecified trigger  Insect bite of right hand, initial encounter     Discharge Medication List as of 09/02/2016  4:30 PM    START taking these medications   Details  amoxicillin (AMOXIL) 875 MG tablet Take 1 tablet (875 mg total) by mouth 2 (two) times daily., Starting Wed 09/02/2016, Normal    loratadine (CLARITIN) 10 MG tablet Take 1 tablet (10 mg total) by mouth daily., Starting Wed 09/02/2016, Normal        Note: This dictation was prepared with  Dragon dictation along with smaller phrase technology. Any transcriptional errors that result from this process are unintentional.         Marylene Land, NP 09/02/16 1648

## 2016-09-02 NOTE — ED Triage Notes (Signed)
Cough, sore throat, ear pain, fever, congestion, since last Saturday. States insect bite to right hand and associates symptoms following bite. Right hand insect bite appears healing well.

## 2016-09-02 NOTE — Discharge Instructions (Signed)
Take medication as prescribed. Rest. Drink plenty of fluids.  ° °Follow up with your primary care physician this week as needed. Return to Urgent care for new or worsening concerns.  ° °

## 2016-09-08 ENCOUNTER — Ambulatory Visit
Admission: EM | Admit: 2016-09-08 | Discharge: 2016-09-08 | Disposition: A | Discharge status: Home / Self Care | Payer: Medicare Other | Attending: Family Medicine | Admitting: Family Medicine

## 2016-09-08 ENCOUNTER — Encounter: Payer: Self-pay | Admitting: Emergency Medicine

## 2016-09-08 DIAGNOSIS — H60392 Other infective otitis externa, left ear: Secondary | ICD-10-CM

## 2016-09-08 DIAGNOSIS — T464X5A Adverse effect of angiotensin-converting-enzyme inhibitors, initial encounter: Secondary | ICD-10-CM

## 2016-09-08 DIAGNOSIS — R05 Cough: Secondary | ICD-10-CM

## 2016-09-08 DIAGNOSIS — M1A9XX0 Chronic gout, unspecified, without tophus (tophi): Secondary | ICD-10-CM | POA: Diagnosis not present

## 2016-09-08 MED ORDER — HYDROCOD POLST-CPM POLST ER 10-8 MG/5ML PO SUER: 2.5000 mL | Freq: Two times a day (BID) | ORAL | 0 refills | Status: DC

## 2016-09-08 MED ORDER — COLCHICINE 0.6 MG PO TABS: ORAL_TABLET | ORAL | 0 refills | Status: DC

## 2016-09-08 MED ORDER — HYDROCORTISONE-ACETIC ACID 1-2 % OT SOLN: 3.0000 [drp] | Freq: Four times a day (QID) | OTIC | 0 refills | Status: DC

## 2016-09-08 NOTE — ED Triage Notes (Signed)
Patient c/o pain and redness in her left 1st toe for the past couple of days.

## 2016-09-08 NOTE — ED Provider Notes (Signed)
CSN: [id number]     Arrival date & time 09/08/16  1543 History   First MD Initiated Contact with Patient 09/08/16 1616     Chief Complaint  Patient presents with  . Toe Pain   (Consider location/radiation/quality/duration/timing/severity/associated sxs/prior Treatment) HPI  This a 66 year old female who was here last week and diagnosed with Pharyngitis and right otitis media. She is placed on amoxicillin and Tessalon Perles. He states that today she returns with 3 separate problems.  Problem is that of gout of her left big toe. She has a history of gout and is currently taking allopurinol twice daily and is watching her diet very carefully stating that she does not drink beer anymore does not eat meat and has been eating mostly beans and greens. The pain has been present for a couple of days and has not responded to any of her home remedies. Now it is become so bad that she was able to sleep last night because of the pain in her toe as well as coughing.  Her second complaint is that of coughing. She has his nagging cough that is productive of white phlegm. She's had a recent change in her blood pressure medications and was recently started on lisinopril. She started the cough roughly around the time of the initiation of lisinopril. She has no shortness of breath. Her coughing is seemingly constant and will be coming paroxyms. He said no fever or chills.  Her third problem is left ear itching pain. This is feels as if it is very itchy deep inside. Is no drainage. She does not have tinnitus. Past Medical History:  Diagnosis Date  . Acid reflux   . Asthma   . Diabetes mellitus without complication ([place])   . Diverticulitis 2013  . Hypertension    Past Surgical History:  Procedure Laterality Date  . APPENDECTOMY    . COLON RESECTION  2013   Dr Marina Gravel  . COLONOSCOPY  2015   Dr Ernst Breach  . HERNIA REPAIR  2013  . SHOULDER ARTHROSCOPY W/ ROTATOR CUFF REPAIR Left 2016   Family History  Problem  Relation Age of Onset  . Cancer Maternal Grandmother        breast  . Cancer Sister        breast age 63"s  . Cancer Maternal Aunt        breast   Social History  Substance Use Topics  . Smoking status: Former Smoker    Quit date: 04/21/2011  . Smokeless tobacco: Never Used  . Alcohol use No   OB History    Gravida Para Term Preterm AB Living   2 2       2    SAB TAB Ectopic Multiple Live Births                  Obstetric Comments   1st Menstrual Cycle:  13 1st Pregnancy:  20      Review of Systems  Constitutional: Positive for activity change. Negative for appetite change, chills, fatigue and fever.  HENT: Positive for ear pain.   Respiratory: Positive for cough. Negative for shortness of breath, wheezing and stridor.   Musculoskeletal: Positive for arthralgias.  All other systems reviewed and are negative.   Allergies  Vicodin [hydrocodone-acetaminophen]  Home Medications   Prior to Admission medications   Medication Sig Start Date End Date Taking? Authorizing Provider  ACCU-CHEK AVIVA PLUS test strip  07/11/14   [provider]  acetic acid-hydrocortisone (VOSOL-HC) otic solution  Place 3 drops into the left ear 4 (four) times daily. 09/08/16   Lorin Picket, PA-C  albuterol (PROVENTIL HFA;VENTOLIN HFA) 108 (90 Base) MCG/ACT inhaler Inhale 2 puffs into the lungs every 4 (four) hours as needed for wheezing or shortness of breath. 7/78/24   Defelice, Jeanett Schlein, NP  allopurinol (ZYLOPRIM) 100 MG tablet Take 100 mg by mouth daily.    [provider]  amoxicillin (AMOXIL) 875 MG tablet Take 1 tablet (875 mg total) by mouth 2 (two) times daily. 09/02/16   Marylene Land, NP  aspirin 325 MG EC tablet Take 325 mg by mouth daily.    [provider]  benzonatate (TESSALON PERLES) 100 MG capsule Take 1 capsule (100 mg total) by mouth 3 (three) times daily as needed for cough. 09/02/16   Marylene Land, NP  chlorpheniramine-HYDROcodone [facility]  PENNKINETIC ER) 10-8 MG/5ML SUER Take 2.5 mLs by mouth 2 (two) times daily. As necessary for cough 09/08/16   Crecencio Mc P, PA-C  colchicine 0.6 MG tablet Take 2 tablets (1.2 mg) initially. Repeat with 1 tablet( 0.6 mg) in one hour. Do not take anymore on the first day. On second day start taking 1 daily until gout attack has resolved. 09/08/16   Lorin Picket, PA-C  fluticasone (FLONASE) 50 MCG/ACT nasal spray as needed.  10/01/14   [provider]  loratadine (CLARITIN) 10 MG tablet Take 1 tablet (10 mg total) by mouth daily. 09/02/16   Marylene Land, NP  metFORMIN (GLUCOPHAGE-XR) 500 MG 24 hr tablet TK 1 T PO QD 09/13/14   [provider]  mupirocin ointment (BACTROBAN) 2 % APPLY TOPICALLY TO AFFECTED AREA BID FOR 11 DAYS 06/30/14   [provider]  omeprazole (PRILOSEC) 40 MG capsule Take 40 mg by mouth daily.    [provider]  spironolactone (ALDACTONE) 25 MG tablet Take 25 mg by mouth daily.    [provider]   Meds Ordered and Administered this Visit  Medications - No data to display  BP (!) 163/96 (BP Location: Right Arm)   Pulse 80   Temp 98.1 F (36.7 C) (Oral)   Resp 16   Ht 5\' 5"  (1.651 m)   Wt 190 lb (86.2 kg)   SpO2 97%   BMI 31.62 kg/m  No data found.   Physical Exam  Constitutional: She is oriented to person, place, and time. She appears well-developed and well-nourished. No distress.  HENT:  Head: Normocephalic and atraumatic.  Right Ear: External ear normal.  Nose: Nose normal.  Mouth/Throat: Oropharynx is clear and moist. No oropharyngeal exudate.  Emanation of the left ear canal shows it to be swollen with flaky skin present. Excoriations are seen. No drainage is seen. Only a small glimpse of the TM was visible but did not show any erythema.  Eyes: Pupils are equal, round, and reactive to light. Right eye exhibits no discharge. Left eye exhibits no discharge.  Neck: Normal range of motion.  Pulmonary/Chest:  Effort normal and breath sounds normal. No respiratory distress. She has no wheezes. She has no rales.  Musculoskeletal: She exhibits edema and tenderness.  Examination of the left foot shows a swollen erythematous warm first MTP. Any motion causes severe pain. The remainder of the foot and ankle are within normal limits.  Neurological: She is alert and oriented to person, place, and time.  Skin: Skin is warm and dry. She is not diaphoretic. There is erythema.  Psychiatric: She has a normal mood and affect. Her  behavior is normal. Judgment and thought content normal.  Nursing note and vitals reviewed.   Urgent Care Course     Procedures (including critical care time)  Labs Review Labs Reviewed - No data to display  Imaging Review No results found.   Visual Acuity Review  Right Eye Distance:   Left Eye Distance:   Bilateral Distance:    Right Eye Near:   Left Eye Near:    Bilateral Near:         MDM   1. Chronic gout involving toe of left foot without tophus, unspecified cause   2. Cough due to angiotensin-converting enzyme inhibitor   3. Other infective acute otitis externa of left ear    Discharge Medication List as of 09/08/2016  4:47 PM    START taking these medications   Details  acetic acid-hydrocortisone (VOSOL-HC) otic solution Place 3 drops into the left ear 4 (four) times daily., Starting Tue 09/08/2016, Normal    chlorpheniramine-HYDROcodone (TUSSIONEX PENNKINETIC ER) 10-8 MG/5ML SUER Take 2.5 mLs by mouth 2 (two) times daily. As necessary for cough, Starting Tue 09/08/2016, Print    colchicine 0.6 MG tablet Take 2 tablets (1.2 mg) initially. Repeat with 1 tablet( 0.6 mg) in one hour. Do not take anymore on the first day. On second day start taking 1 daily until gout attack has resolved., Print      Plan: 1. Test/x-ray results and diagnosis reviewed with patient 2. rx as per orders; risks, benefits, potential side effects reviewed with patient 3.  Recommend supportive treatment with  Warm Compresses to her left great toe for comfort. Specific instructions for the use of colchicine was given to the patient. She was told by her other physicians that she should not be using nostril NSAID drugs due to her hypertension and her diabetes. Start her on colchicine. Her ear otitis externa is most likely a fungal source. She will use the eardrops and if it is not improving see her primary care physician. The cough is very likely  suspect because of the ACE inhibitor. Because this of asked her to contact her primary care physician tomorrow for his input. I did not stop her from taking at this point in time and will defer to him. In order for her to get adequate rest at night I did give her a small prescription of Tussionex. Will need to follow-up closely with her primary care physician 4. F/u prn if symptoms worsen or don't improve     Lorin Picket, PA-C 09/08/16 1708

## 2016-10-05 ENCOUNTER — Other Ambulatory Visit: Payer: Self-pay | Admitting: Physician Assistant

## 2016-10-05 DIAGNOSIS — Z1231 Encounter for screening mammogram for malignant neoplasm of breast: Secondary | ICD-10-CM

## 2016-10-13 ENCOUNTER — Ambulatory Visit
Admission: RE | Admit: 2016-10-13 | Discharge: 2016-10-13 | Disposition: A | Discharge status: Home / Self Care | Payer: Medicare Other | Source: Ambulatory Visit | Attending: Physician Assistant | Admitting: Physician Assistant

## 2016-10-13 DIAGNOSIS — Z1231 Encounter for screening mammogram for malignant neoplasm of breast: Secondary | ICD-10-CM | POA: Diagnosis not present

## 2016-10-15 ENCOUNTER — Other Ambulatory Visit: Payer: Self-pay | Admitting: *Deleted

## 2016-10-15 ENCOUNTER — Inpatient Hospital Stay
Admission: RE | Admit: 2016-10-15 | Discharge: 2016-10-15 | Disposition: A | Discharge status: Home / Self Care | Payer: Self-pay | Source: Ambulatory Visit | Attending: *Deleted | Admitting: *Deleted

## 2016-10-15 DIAGNOSIS — Z9289 Personal history of other medical treatment: Secondary | ICD-10-CM

## 2017-01-14 ENCOUNTER — Ambulatory Visit
Admission: EM | Admit: 2017-01-14 | Discharge: 2017-01-14 | Disposition: A | Discharge status: Home / Self Care | Payer: Medicare Other | Attending: Family Medicine | Admitting: Family Medicine

## 2017-01-14 DIAGNOSIS — R059 Cough, unspecified: Secondary | ICD-10-CM

## 2017-01-14 DIAGNOSIS — R05 Cough: Secondary | ICD-10-CM

## 2017-01-14 DIAGNOSIS — J069 Acute upper respiratory infection, unspecified: Secondary | ICD-10-CM | POA: Diagnosis not present

## 2017-01-14 DIAGNOSIS — B349 Viral infection, unspecified: Secondary | ICD-10-CM | POA: Diagnosis not present

## 2017-01-14 MED ORDER — PREDNISONE 20 MG PO TABS: 40.0000 mg | ORAL_TABLET | Freq: Every day | ORAL | 0 refills | Status: DC

## 2017-01-14 MED ORDER — HYDROCOD POLST-CPM POLST ER 10-8 MG/5ML PO SUER: 5.0000 mL | Freq: Every evening | ORAL | 0 refills | Status: DC | PRN

## 2017-01-14 NOTE — ED Provider Notes (Signed)
[facility]    CSN: [id number] Arrival date & time: 01/14/17  1746     History   Chief Complaint Chief Complaint  Patient presents with  . Cough    HPI Brenda Lin is a 66 y.o. female.   66 year old female presents with cough with clear congestion for the past 5 days. She received her flu shot 6 days ago and started with cough and congestion the next day. Denies any fever or GI symptoms. Has felt warm with chills and slight nasal congesiton. She has taken OTC cough medications and Albuterol inhaler with no relief. She has also taken Tessalon perles in the past with no success. No other family members ill. She has a history of diabetes, HTN, gout, asthma and GERD and takes multiple medications for management.    The history is provided by the patient.    Past Medical History:  Diagnosis Date  . Acid reflux   . Asthma   . Diabetes mellitus without complication ([place])   . Diverticulitis 2013  . Hypertension     Patient Active Problem List   Diagnosis Date Noted  . Nipple discharge 10/03/2014    Past Surgical History:  Procedure Laterality Date  . APPENDECTOMY    . COLON RESECTION  2013   Dr Marina Gravel  . COLONOSCOPY  2015   Dr Ernst Breach  . HERNIA REPAIR  2013  . SHOULDER ARTHROSCOPY W/ ROTATOR CUFF REPAIR Left 2016    OB History    Gravida Para Term Preterm AB Living   2 2       2    SAB TAB Ectopic Multiple Live Births                  Obstetric Comments   1st Menstrual Cycle:  13 1st Pregnancy:  20        Home Medications    Prior to Admission medications   Medication Sig Start Date End Date Taking? Authorizing Provider  losartan (COZAAR) 25 MG tablet Take 25 mg by mouth daily.   Yes [provider]  ACCU-CHEK AVIVA PLUS test strip  07/11/14   [provider]  albuterol (PROVENTIL HFA;VENTOLIN HFA) 108 (90 Base) MCG/ACT inhaler Inhale 2 puffs into the lungs every 4 (four) hours as needed for wheezing or shortness of breath.  2/95/18   Defelice, Jeanett Schlein, NP  allopurinol (ZYLOPRIM) 100 MG tablet Take 100 mg by mouth daily.    [provider]  aspirin 325 MG EC tablet Take 325 mg by mouth daily.    [provider]  chlorpheniramine-HYDROcodone (TUSSIONEX PENNKINETIC ER) 10-8 MG/5ML SUER Take 5 mLs by mouth at bedtime as needed for cough. 01/14/17   Katy Apo, NP  colchicine 0.6 MG tablet Take 2 tablets (1.2 mg) initially. Repeat with 1 tablet( 0.6 mg) in one hour. Do not take anymore on the first day. On second day start taking 1 daily until gout attack has resolved. 09/08/16   Lorin Picket, PA-C  fluticasone (FLONASE) 50 MCG/ACT nasal spray as needed.  10/01/14   [provider]  metFORMIN (GLUCOPHAGE-XR) 500 MG 24 hr tablet TK 1 T PO QD 09/13/14   [provider]  predniSONE (DELTASONE) 20 MG tablet Take 2 tablets (40 mg total) by mouth daily. 01/14/17 01/19/17  Katy Apo, NP    Family History Family History  Problem Relation Age of Onset  . Cancer Maternal Grandmother        breast  .  Breast cancer Maternal Grandmother   . Cancer Sister        breast age 69"s  . Breast cancer Sister   . Cancer Maternal Aunt        breast  . Breast cancer Maternal Aunt   . Breast cancer Cousin        mat cousin    Social History Social History  Substance Use Topics  . Smoking status: Former Smoker    Quit date: 04/21/2011  . Smokeless tobacco: Never Used  . Alcohol use No     Allergies   Vicodin [hydrocodone-acetaminophen]   Review of Systems Review of Systems  Constitutional: Positive for chills and fatigue. Negative for activity change, appetite change and fever.  HENT: Positive for congestion, postnasal drip and rhinorrhea. Negative for ear discharge, ear pain, facial swelling, mouth sores, sneezing, sore throat and trouble swallowing.   Eyes: Negative for pain, discharge, redness and itching.  Respiratory: Positive for cough and wheezing. Negative for chest  tightness and shortness of breath.   Cardiovascular: Negative for chest pain and palpitations.  Gastrointestinal: Negative for abdominal pain, diarrhea, nausea and vomiting.  Genitourinary: Negative for difficulty urinating, dysuria and flank pain.  Musculoskeletal: Positive for myalgias. Negative for arthralgias, back pain, joint swelling, neck pain and neck stiffness.  Skin: Negative for rash and wound.  Neurological: Negative for dizziness, tremors, seizures, syncope, weakness, light-headedness, numbness and headaches.  Hematological: Negative for adenopathy.     Physical Exam Triage Vital Signs ED Triage Vitals  Enc Vitals Group     BP 01/14/17 1804 (!) 169/109     Pulse Rate 01/14/17 1804 90     Resp 01/14/17 1804 20     Temp 01/14/17 1804 98.9 F (37.2 C)     Temp Source 01/14/17 1804 Oral     SpO2 01/14/17 1804 98 %     Weight 01/14/17 1806 185 lb (83.9 kg)     Height 01/14/17 1806 5\' 5"  (1.651 m)     Head Circumference --      Peak Flow --      Pain Score 01/14/17 1807 3     Pain Loc --      Pain Edu? --      Excl. in [place]? --    No data found.   Updated Vital Signs BP (!) 169/109 (BP Location: Left Arm)   Pulse 90   Temp 98.9 F (37.2 C) (Oral)   Resp 20   Ht 5\' 5"  (1.651 m)   Wt 185 lb (83.9 kg)   SpO2 98%   BMI 30.79 kg/m   Visual Acuity Right Eye Distance:   Left Eye Distance:   Bilateral Distance:    Right Eye Near:   Left Eye Near:    Bilateral Near:     Physical Exam  Constitutional: She is oriented to person, place, and time. She appears well-developed and well-nourished. She does not appear ill. No distress.  HENT:  Head: Normocephalic and atraumatic.  Right Ear: Hearing, tympanic membrane, external ear and ear canal normal.  Left Ear: Hearing, tympanic membrane, external ear and ear canal normal.  Nose: Rhinorrhea present. No mucosal edema. Right sinus exhibits no maxillary sinus tenderness and no frontal sinus tenderness. Left sinus  exhibits no maxillary sinus tenderness and no frontal sinus tenderness.  Mouth/Throat: Uvula is midline, oropharynx is clear and moist and mucous membranes are normal.  Eyes: Conjunctivae and EOM are normal.  Neck: Normal range of motion. Neck supple.  Cardiovascular: Normal rate, regular rhythm and normal heart sounds.   Pulmonary/Chest: Effort normal. No respiratory distress. She has decreased breath sounds in the right upper field, the right lower field, the left upper field and the left lower field. She has no wheezes. She has rhonchi in the right upper field and the left upper field.  Coarse breath sounds in the upper lobes when coughing  Lymphadenopathy:    She has no cervical adenopathy.  Neurological: She is alert and oriented to person, place, and time.  Skin: Skin is warm and dry. Capillary refill takes less than 2 seconds. No rash noted.  Psychiatric: She has a normal mood and affect. Her behavior is normal. Judgment and thought content normal.     UC Treatments / Results  Labs (all labs ordered are listed, but only abnormal results are displayed) Labs Reviewed - No data to display  EKG  EKG Interpretation None       Radiology No results found.  Procedures Procedures (including critical care time)  Medications Ordered in UC Medications - No data to display   Initial Impression / Assessment and Plan / UC Course  I have reviewed the triage vital signs and the nursing notes.  Pertinent labs & imaging results that were available during my care of the patient were reviewed by me and considered in my medical decision making (see chart for details).    Discussed that she probably has a viral illness causing the cough- may be mild bronchitis or flare up of asthma. Recommend continue Albuterol inhaler as directed. Start Prednisone 40mg  daily for the next 5 days and then stop. May continue cough drops during the day. May take Tussionex 1 teaspoon at night as needed.  Continue to increase fluids to help loosen up any mucus. Continue to monitor symptoms. Follow-up with her PCP in 4 to 5 days if not improving or sooner if symptoms worsen.    Final Clinical Impressions(s) / UC Diagnoses   Final diagnoses:  Cough  Viral upper respiratory tract infection    New Prescriptions Discharge Medication List as of 01/14/2017  6:57 PM    START taking these medications   Details  predniSONE (DELTASONE) 20 MG tablet Take 2 tablets (40 mg total) by mouth daily., Starting Thu 01/14/2017, Until Tue 01/19/2017, Normal         Controlled Substance Prescriptions [place] Controlled Substance Registry consulted? Yes, I have consulted the [place] Controlled Substances Registry for this patient, and feel the risk/benefit ratio today is favorable for proceeding with this prescription for a controlled substance. Last active Rx was in May 2018 for Tussionex. No other active Rx in the past 6 months.    Katy Apo, NP 01/15/17 1105

## 2017-01-14 NOTE — Discharge Instructions (Addendum)
Recommend continue Albuterol inhaler as directed. Start Prednisone 40mg  daily for the next 5 days and then stop. May continue cough drops during the day. May take Tussionex 1 teaspoon at night as needed. Continue to increase fluids to help loosen up any mucus. Follow-up with your PCP in 4 to 5 days if not improving or sooner if symptoms worsen.

## 2017-01-14 NOTE — ED Triage Notes (Signed)
"  I've been sick ever since I got my flu shot on Sat." Pt with cough productive of clear phlegm. "I've coughed so much I think I've pulled a muscle."  No fever.

## 2017-01-17 ENCOUNTER — Encounter: Payer: Self-pay | Admitting: Emergency Medicine

## 2017-01-17 ENCOUNTER — Ambulatory Visit
Admission: EM | Admit: 2017-01-17 | Discharge: 2017-01-17 | Disposition: A | Discharge status: Home / Self Care | Payer: Medicare Other | Attending: Family Medicine | Admitting: Family Medicine

## 2017-01-17 DIAGNOSIS — J0100 Acute maxillary sinusitis, unspecified: Secondary | ICD-10-CM | POA: Diagnosis not present

## 2017-01-17 DIAGNOSIS — I10 Essential (primary) hypertension: Secondary | ICD-10-CM | POA: Diagnosis not present

## 2017-01-17 DIAGNOSIS — J209 Acute bronchitis, unspecified: Secondary | ICD-10-CM | POA: Diagnosis not present

## 2017-01-17 MED ORDER — AMOXICILLIN-POT CLAVULANATE 875-125 MG PO TABS: 1.0000 | ORAL_TABLET | Freq: Two times a day (BID) | ORAL | 0 refills | Status: AC

## 2017-01-17 MED ORDER — FLUCONAZOLE 150 MG PO TABS: ORAL_TABLET | ORAL | 0 refills | Status: DC

## 2017-01-17 MED ORDER — SPIRONOLACTONE 25 MG PO TABS: 25.0000 mg | ORAL_TABLET | Freq: Every day | ORAL | 0 refills | Status: DC

## 2017-01-17 NOTE — Discharge Instructions (Addendum)
Recommend stop Prednisone since this has not been effective for your cough and may be causing your blood pressure to increase. Recommend start Augmentin 875mg  twice a day as directed for sinus and bronchitis. Take Diflucan 150mg  today and repeat 1 tablet in 5 days to prevent antibiotic-induced yeast infection. Continue Albuterol as needed for cough and wheezing. May restart Spironolactone for blood pressure- but will increase your potassium level long-term when taking with Losartan. Need to follow-up with your primary care provider for additional blood pressure management. Continue to increase fluids and water intake. Follow-up with your PCP as planned or go to the ER if symptoms worsen.

## 2017-01-17 NOTE — ED Triage Notes (Signed)
Patient c/o elevated blood pressure since Wed.  Patient reports that her BP was elevated when she was seen here on Wed.

## 2017-01-17 NOTE — ED Provider Notes (Signed)
[facility]    CSN: [id number] Arrival date & time: 01/17/17  1023     History   Chief Complaint Chief Complaint  Patient presents with  . Hypertension    HPI Brenda Lin is a 66 y.o. female.   66 year old female presents for recheck of her blood pressure and continued cough and congestion. She was seen 3 days ago for cough and chest congestion. Now having more sinus pressure and nasal congestion that is persisting beyond a week. She denies any symptoms of elevated blood pressure although uncertain if headache she had yesterday was due to sinus pressure or due to elevated blood pressure. She denies any fever, dizziness, palpitations or GI symptoms. She has taken the Prednisone and Albuterol as directed which can elevate her BP and pulse but has not been very helpful for her cough. She did not get Tussionex due to cost. She developed a cough from ACE inhibitors and should not taken Beta-blockers due to her asthma. She has taken Spironolactone in the past with good success. Other chronic health issues include gout, diabetes and GERD. She is on allopurinol for gout but it not currently taking Colchicine.    The history is provided by the patient.    Past Medical History:  Diagnosis Date  . Acid reflux   . Asthma   . Diabetes mellitus without complication ([place])   . Diverticulitis 2013  . Hypertension     Patient Active Problem List   Diagnosis Date Noted  . Nipple discharge 10/03/2014    Past Surgical History:  Procedure Laterality Date  . APPENDECTOMY    . COLON RESECTION  2013   Dr Marina Gravel  . COLONOSCOPY  2015   Dr Ernst Breach  . HERNIA REPAIR  2013  . SHOULDER ARTHROSCOPY W/ ROTATOR CUFF REPAIR Left 2016    OB History    Gravida Para Term Preterm AB Living   2 2       2    SAB TAB Ectopic Multiple Live Births                  Obstetric Comments   1st Menstrual Cycle:  13 1st Pregnancy:  20        Home Medications    Prior to Admission  medications   Medication Sig Start Date End Date Taking? Authorizing Provider  ACCU-CHEK AVIVA PLUS test strip  07/11/14   [provider]  albuterol (PROVENTIL HFA;VENTOLIN HFA) 108 (90 Base) MCG/ACT inhaler Inhale 2 puffs into the lungs every 4 (four) hours as needed for wheezing or shortness of breath. 5/42/70   Defelice, Jeanett Schlein, NP  allopurinol (ZYLOPRIM) 100 MG tablet Take 100 mg by mouth daily.    [provider]  amoxicillin-clavulanate (AUGMENTIN) 875-125 MG tablet Take 1 tablet by mouth every 12 (twelve) hours. 01/17/17 01/24/17  Katy Apo, NP  aspirin 325 MG EC tablet Take 325 mg by mouth daily.    [provider]  colchicine 0.6 MG tablet Take 2 tablets (1.2 mg) initially. Repeat with 1 tablet( 0.6 mg) in one hour. Do not take anymore on the first day. On second day start taking 1 daily until gout attack has resolved. 09/08/16   Lorin Picket, PA-C  fluconazole (DIFLUCAN) 150 MG tablet Take 1 tablet by mouth today, then repeat 1 tablet in 5 days. 01/17/17   Katy Apo, NP  fluticasone (FLONASE) 50 MCG/ACT nasal spray as needed.  10/01/14   [provider]  losartan (COZAAR) 25 MG tablet Take 25 mg by mouth daily.    [provider]  metFORMIN (GLUCOPHAGE-XR) 500 MG 24 hr tablet TK 1 T PO QD 09/13/14   [provider]  spironolactone (ALDACTONE) 25 MG tablet Take 1 tablet (25 mg total) by mouth daily. 01/17/17 02/01/17  Katy Apo, NP    Family History Family History  Problem Relation Age of Onset  . Cancer Maternal Grandmother        breast  . Breast cancer Maternal Grandmother   . Cancer Sister        breast age 57"s  . Breast cancer Sister   . Cancer Maternal Aunt        breast  . Breast cancer Maternal Aunt   . Breast cancer Cousin        mat cousin    Social History Social History  Substance Use Topics  . Smoking status: Former Smoker    Quit date: 04/21/2011  . Smokeless tobacco: Never Used  .  Alcohol use No     Allergies   Vicodin [hydrocodone-acetaminophen]   Review of Systems Review of Systems  Constitutional: Positive for appetite change and fatigue. Negative for chills, diaphoresis and fever.  HENT: Positive for congestion, postnasal drip, rhinorrhea, sinus pain and sinus pressure. Negative for ear discharge, ear pain, facial swelling, mouth sores, nosebleeds, sneezing, sore throat and trouble swallowing.   Eyes: Negative for discharge, redness and itching.  Respiratory: Positive for cough, shortness of breath and wheezing. Negative for chest tightness.   Cardiovascular: Negative for chest pain, palpitations and leg swelling.  Gastrointestinal: Negative for abdominal pain, diarrhea, nausea and vomiting.  Genitourinary: Negative for decreased urine volume and difficulty urinating.  Musculoskeletal: Positive for arthralgias and myalgias. Negative for back pain, neck pain and neck stiffness.  Skin: Negative for rash and wound.  Allergic/Immunologic: Positive for environmental allergies.  Neurological: Positive for headaches. Negative for dizziness, tremors, seizures, syncope, facial asymmetry, speech difficulty, weakness, light-headedness and numbness.  Hematological: Negative for adenopathy. Does not bruise/bleed easily.     Physical Exam Triage Vital Signs ED Triage Vitals  Enc Vitals Group     BP 01/17/17 1043 (S) (!) 183/91     Pulse Rate 01/17/17 1043 84     Resp 01/17/17 1043 16     Temp 01/17/17 1043 98.5 F (36.9 C)     Temp Source 01/17/17 1043 Oral     SpO2 01/17/17 1043 99 %     Weight 01/17/17 1041 185 lb (83.9 kg)     Height 01/17/17 1041 5\' 5"  (1.651 m)     Head Circumference --      Peak Flow --      Pain Score 01/17/17 1041 0     Pain Loc --      Pain Edu? --      Excl. in [place]? --    No data found.   Updated Vital Signs BP (!) 154/100 (BP Location: Left Arm)   Pulse 84   Temp 98.5 F (36.9 C) (Oral)   Resp 16   Ht 5\' 5"  (1.651 m)   Wt  185 lb (83.9 kg)   SpO2 99%   BMI 30.79 kg/m   Visual Acuity Right Eye Distance:   Left Eye Distance:   Bilateral Distance:    Right Eye Near:   Left Eye Near:    Bilateral Near:     Physical Exam  Constitutional: She is oriented to person, place, and  time. She appears well-developed and well-nourished. She is cooperative. No distress.  Pleasant woman sitting comfortably in exam chair in no distress.   HENT:  Head: Normocephalic and atraumatic.  Right Ear: Hearing, external ear and ear canal normal. Tympanic membrane is bulging.  Left Ear: Hearing, external ear and ear canal normal. Tympanic membrane is bulging.  Nose: Mucosal edema and rhinorrhea present. Right sinus exhibits maxillary sinus tenderness (mild). Right sinus exhibits no frontal sinus tenderness. Left sinus exhibits maxillary sinus tenderness (mild). Left sinus exhibits no frontal sinus tenderness.  Mouth/Throat: Uvula is midline and mucous membranes are normal. Posterior oropharyngeal erythema present.  Eyes: Conjunctivae and EOM are normal.  Neck: Normal range of motion. Neck supple.  Cardiovascular: Normal rate, regular rhythm and normal heart sounds.   Pulmonary/Chest: Effort normal. No respiratory distress. She has decreased breath sounds in the right upper field, the right lower field, the left upper field and the left lower field. She has wheezes in the right upper field, the right middle field, the right lower field and the left upper field. She has rhonchi in the right upper field, the right lower field, the left upper field and the left lower field. She exhibits tenderness.    Slightly tender along 10th rib on right side- mid clavicular. No swelling present.   Musculoskeletal: Normal range of motion.  Lymphadenopathy:    She has no cervical adenopathy.  Neurological: She is alert and oriented to person, place, and time.  Skin: Skin is warm and dry. Capillary refill takes less than 2 seconds. No rash noted.    Psychiatric: She has a normal mood and affect. Her behavior is normal. Judgment and thought content normal.     UC Treatments / Results  Labs (all labs ordered are listed, but only abnormal results are displayed) Labs Reviewed - No data to display  EKG  EKG Interpretation None       Radiology No results found.  Procedures Procedures (including critical care time)  Medications Ordered in UC Medications - No data to display   Initial Impression / Assessment and Plan / UC Course  I have reviewed the triage vital signs and the nursing notes.  Pertinent labs & imaging results that were available during my care of the patient were reviewed by me and considered in my medical decision making (see chart for details).    Nurse had rechecked blood pressure- had decreased to 154/100. Since patient tolerated Spironolactone in the past with start with 25mg  daily. However, this medication combined with Losartan can raise her potassium level so she needs to see her PCP for long-term solution to elevated BP. Continue to monitor her BP daily. If BP continues to be >160/90, go to ER for further evaluation. Recommend stop Prednisone since this has not been effective for her cough and may be causing her blood pressure to increase. Recommend start Augmentin 875mg  twice a day as directed for sinus and bronchitis. Take Diflucan 150mg  today and repeat 1 tablet in 5 days to prevent antibiotic-induced yeast infection. Continue Albuterol as needed for cough and wheezing. Continue to increase fluids and water intake to help loosen up mucus. May take OTC Delsym as directed for cough. Call her PCP tomorrow (Monday) to schedule appointment for follow-up this week or go to the ER if symptoms worsen.    Final Clinical Impressions(s) / UC Diagnoses   Final diagnoses:  Acute bronchitis, unspecified organism  Acute non-recurrent maxillary sinusitis  Essential hypertension    New Prescriptions  Discharge  Medication List as of 01/17/2017 11:23 AM    START taking these medications   Details  amoxicillin-clavulanate (AUGMENTIN) 875-125 MG tablet Take 1 tablet by mouth every 12 (twelve) hours., Starting Sun 01/17/2017, Until Sun 01/24/2017, Normal    fluconazole (DIFLUCAN) 150 MG tablet Take 1 tablet by mouth today, then repeat 1 tablet in 5 days., Normal    spironolactone (ALDACTONE) 25 MG tablet Take 1 tablet (25 mg total) by mouth daily., Starting Sun 01/17/2017, Until Mon 02/01/2017, Normal         Controlled Substance Prescriptions [place] Controlled Substance Registry consulted? Not Applicable   Katy Apo, NP 01/18/17 1010

## 2017-08-23 ENCOUNTER — Ambulatory Visit
Admission: EM | Admit: 2017-08-23 | Discharge: 2017-08-23 | Disposition: A | Discharge status: Home / Self Care | Payer: Medicare Other | Attending: Family Medicine | Admitting: Family Medicine

## 2017-08-23 ENCOUNTER — Telehealth: Payer: Self-pay | Admitting: Emergency Medicine

## 2017-08-23 ENCOUNTER — Other Ambulatory Visit: Payer: Self-pay

## 2017-08-23 DIAGNOSIS — J069 Acute upper respiratory infection, unspecified: Secondary | ICD-10-CM | POA: Diagnosis not present

## 2017-08-23 MED ORDER — GUAIFENESIN-CODEINE 100-10 MG/5ML PO SYRP: 5.0000 mL | ORAL_SOLUTION | Freq: Three times a day (TID) | ORAL | 0 refills | Status: DC | PRN

## 2017-08-23 MED ORDER — BENZONATATE 200 MG PO CAPS: ORAL_CAPSULE | ORAL | 0 refills | Status: DC

## 2017-08-23 NOTE — ED Provider Notes (Signed)
[facility]    CSN: [id number] Arrival date & time: 08/23/17  1426     History   Chief Complaint Chief Complaint  Patient presents with  . Cough    HPI Brenda Lin is a 67 y.o. female.   HPI  67 year old female presents with nasal congestion and cough productive of only clear sputum.  She has also had sinus type headache.  This started about a week ago.  She has had hot and cold feelings but has not been running a fever having taken her temperature at home.  She is afebrile today O2 sats are 97% on room air. She has been using over-the-counter medications including cough syrups but has been having difficulty sleeping due to the cough.        Past Medical History:  Diagnosis Date  . Acid reflux   . Asthma   . Diabetes mellitus without complication ([place])   . Diverticulitis 2013  . Hypertension     Patient Active Problem List   Diagnosis Date Noted  . Nipple discharge 10/03/2014    Past Surgical History:  Procedure Laterality Date  . APPENDECTOMY    . COLON RESECTION  2013   Dr Marina Gravel  . COLONOSCOPY  2015   Dr Ernst Breach  . HERNIA REPAIR  2013  . SHOULDER ARTHROSCOPY W/ ROTATOR CUFF REPAIR Left 2016    OB History    Gravida  2   Para  2   Term      Preterm      AB      Living  2     SAB      TAB      Ectopic      Multiple      Live Births           Obstetric Comments  1st Menstrual Cycle:  13 1st Pregnancy:  20          Home Medications    Prior to Admission medications   Medication Sig Start Date End Date Taking? Authorizing Provider  ACCU-CHEK AVIVA PLUS test strip  07/11/14   [provider]  albuterol (PROVENTIL HFA;VENTOLIN HFA) 108 (90 Base) MCG/ACT inhaler Inhale 2 puffs into the lungs every 4 (four) hours as needed for wheezing or shortness of breath. 12/02/46   Defelice, Jeanett Schlein, NP  allopurinol (ZYLOPRIM) 100 MG tablet Take 100 mg by mouth daily.    [provider]  aspirin 325 MG EC  tablet Take 325 mg by mouth daily.    [provider]  colchicine 0.6 MG tablet Take 2 tablets (1.2 mg) initially. Repeat with 1 tablet( 0.6 mg) in one hour. Do not take anymore on the first day. On second day start taking 1 daily until gout attack has resolved. 09/08/16   Lorin Picket, PA-C  fluconazole (DIFLUCAN) 150 MG tablet Take 1 tablet by mouth today, then repeat 1 tablet in 5 days. 01/17/17   Katy Apo, NP  fluticasone (FLONASE) 50 MCG/ACT nasal spray as needed.  10/01/14   [provider]  losartan (COZAAR) 25 MG tablet Take 25 mg by mouth daily.    [provider]  metFORMIN (GLUCOPHAGE-XR) 500 MG 24 hr tablet TK 1 T PO QD 09/13/14   [provider]  spironolactone (ALDACTONE) 25 MG tablet Take 1 tablet (25 mg total) by mouth daily. 01/17/17 02/01/17  Katy Apo, NP    Family History Family History  Problem Relation Age of Onset  .  Cancer Maternal Grandmother        breast  . Breast cancer Maternal Grandmother   . Cancer Sister        breast age 51"s  . Breast cancer Sister   . Cancer Maternal Aunt        breast  . Breast cancer Maternal Aunt   . Breast cancer Cousin        mat cousin    Social History Social History   Tobacco Use  . Smoking status: Former Smoker    Types: Cigarettes    Last attempt to quit: 04/21/2011    Years since quitting: 6.3  . Smokeless tobacco: Never Used  Substance Use Topics  . Alcohol use: No    Alcohol/week: 0.0 oz    Comment: rare  . Drug use: No     Allergies   Vicodin [hydrocodone-acetaminophen]   Review of Systems Review of Systems  Constitutional: Positive for activity change, chills and fatigue. Negative for fever.  HENT: Positive for congestion and rhinorrhea.   Respiratory: Positive for cough.   All other systems reviewed and are negative.    Physical Exam Triage Vital Signs ED Triage Vitals  Enc Vitals Group     BP 08/23/17 1437 (!) 145/83     Pulse Rate 08/23/17  1437 83     Resp 08/23/17 1437 18     Temp 08/23/17 1437 98 F (36.7 C)     Temp Source 08/23/17 1437 Oral     SpO2 08/23/17 1437 97 %     Weight 08/23/17 1439 189 lb (85.7 kg)     Height 08/23/17 1439 5\' 5"  (1.651 m)     Head Circumference --      Peak Flow --      Pain Score 08/23/17 1439 4     Pain Loc --      Pain Edu? --      Excl. in [place]? --    No data found.  Updated Vital Signs BP (!) 145/83 (BP Location: Left Arm)   Pulse 83   Temp 98 F (36.7 C) (Oral)   Resp 18   Ht 5\' 5"  (1.651 m)   Wt 189 lb (85.7 kg)   SpO2 97%   BMI 31.45 kg/m   Visual Acuity Right Eye Distance:   Left Eye Distance:   Bilateral Distance:    Right Eye Near:   Left Eye Near:    Bilateral Near:     Physical Exam  Constitutional: She is oriented to person, place, and time. She appears well-developed and well-nourished. No distress.  HENT:  Head: Normocephalic.  Right Ear: External ear normal.  Left Ear: External ear normal.  Nose: Nose normal.  Mouth/Throat: Oropharynx is clear and moist. No oropharyngeal exudate.  Eyes: Pupils are equal, round, and reactive to light. Right eye exhibits no discharge. Left eye exhibits no discharge.  Neck: Normal range of motion.  Pulmonary/Chest: Effort normal and breath sounds normal.  Musculoskeletal: Normal range of motion.  Lymphadenopathy:    She has no cervical adenopathy.  Neurological: She is alert and oriented to person, place, and time.  Skin: Skin is warm and dry. She is not diaphoretic.  Psychiatric: She has a normal mood and affect. Her behavior is normal. Judgment and thought content normal.  Nursing note and vitals reviewed.    UC Treatments / Results  Labs (all labs ordered are listed, but only abnormal results are displayed) Labs Reviewed - No data to display  EKG None  Radiology No results found.  Procedures Procedures (including critical care time)  Medications Ordered in UC Medications - No data to  display  Initial Impression / Assessment and Plan / UC Course  I have reviewed the triage vital signs and the nursing notes.  Pertinent labs & imaging results that were available during my care of the patient were reviewed by me and considered in my medical decision making (see chart for details).     Plan: 1. Test/x-ray results and diagnosis reviewed with patient 2. rx as per orders; risks, benefits, potential side effects reviewed with patient 3. Recommend supportive treatment with cough suppressants.  I told her that antibiotics not indicated at this time but if she continues to have her symptoms she should follow-up with her primary care physician for further evaluation. 4. F/u prn if symptoms worsen or don't improve  Final Clinical Impressions(s) / UC Diagnoses   Final diagnoses:  None   Discharge Instructions   None    ED Prescriptions    None     Controlled Substance Prescriptions [place] Controlled Substance Registry consulted? Not Applicable   Lorin Picket, PA-C 08/23/17 1531

## 2017-08-23 NOTE — ED Triage Notes (Addendum)
Pt with nasal congestion and cough productive of clear phlegm. Also with some "sinus headache"

## 2017-08-23 NOTE — Telephone Encounter (Signed)
Needed to a change pharmacies after the patient was discharged this was performed and medications rerouted.

## 2017-09-15 ENCOUNTER — Ambulatory Visit
Admission: EM | Admit: 2017-09-15 | Discharge: 2017-09-15 | Disposition: A | Discharge status: Home / Self Care | Payer: Medicare Other | Attending: Family Medicine | Admitting: Family Medicine

## 2017-09-15 ENCOUNTER — Other Ambulatory Visit: Payer: Self-pay

## 2017-09-15 DIAGNOSIS — R21 Rash and other nonspecific skin eruption: Secondary | ICD-10-CM

## 2017-09-15 DIAGNOSIS — L299 Pruritus, unspecified: Secondary | ICD-10-CM

## 2017-09-15 DIAGNOSIS — R05 Cough: Secondary | ICD-10-CM | POA: Diagnosis not present

## 2017-09-15 DIAGNOSIS — R059 Cough, unspecified: Secondary | ICD-10-CM

## 2017-09-15 MED ORDER — TRIAMCINOLONE ACETONIDE 0.1 % EX CREA: 1.0000 "application " | TOPICAL_CREAM | Freq: Two times a day (BID) | CUTANEOUS | 0 refills | Status: DC

## 2017-09-15 MED ORDER — PREDNISONE 20 MG PO TABS: 20.0000 mg | ORAL_TABLET | Freq: Every day | ORAL | 0 refills | Status: DC

## 2017-09-15 NOTE — ED Provider Notes (Signed)
[facility]    CSN: [id number] Arrival date & time: 09/15/17  1401     History   Chief Complaint Chief Complaint  Patient presents with  . Rash    HPI Brenda Lin is a 67 y.o. female.   67 yo female with a c/o itchy, red skin lesions on left uper thigh since last week. Denies any pain, fevers, drainage. Also c/o cough for several weeks of clear sputum. Has been using her asthma inhalers due to slight wheezing as well.   The history is provided by the patient.  Rash    Past Medical History:  Diagnosis Date  . Acid reflux   . Asthma   . Diabetes mellitus without complication ([place])   . Diverticulitis 2013  . Hypertension     Patient Active Problem List   Diagnosis Date Noted  . Nipple discharge 10/03/2014    Past Surgical History:  Procedure Laterality Date  . APPENDECTOMY    . COLON RESECTION  2013   Dr Marina Gravel  . COLONOSCOPY  2015   Dr Ernst Breach  . HERNIA REPAIR  2013  . SHOULDER ARTHROSCOPY W/ ROTATOR CUFF REPAIR Left 2016    OB History    Gravida  2   Para  2   Term      Preterm      AB      Living  2     SAB      TAB      Ectopic      Multiple      Live Births           Obstetric Comments  1st Menstrual Cycle:  13 1st Pregnancy:  20          Home Medications    Prior to Admission medications   Medication Sig Start Date End Date Taking? Authorizing Provider  ACCU-CHEK AVIVA PLUS test strip  07/11/14   [provider]  albuterol (PROVENTIL HFA;VENTOLIN HFA) 108 (90 Base) MCG/ACT inhaler Inhale 2 puffs into the lungs every 4 (four) hours as needed for wheezing or shortness of breath. 2/42/68   Defelice, Jeanett Schlein, NP  allopurinol (ZYLOPRIM) 100 MG tablet Take 100 mg by mouth daily.    [provider]  aspirin 325 MG EC tablet Take 325 mg by mouth daily.    [provider]  benzonatate (TESSALON) 200 MG capsule Take one cap TID PRN cough 08/23/17   Lorin Picket, PA-C  colchicine 0.6 MG  tablet Take 2 tablets (1.2 mg) initially. Repeat with 1 tablet( 0.6 mg) in one hour. Do not take anymore on the first day. On second day start taking 1 daily until gout attack has resolved. 09/08/16   Lorin Picket, PA-C  fluticasone (FLONASE) 50 MCG/ACT nasal spray as needed.  10/01/14   [provider]  guaiFENesin-codeine (CHERATUSSIN AC) 100-10 MG/5ML syrup Take 5 mLs by mouth 3 (three) times daily as needed for cough. 08/23/17   Lorin Picket, PA-C  losartan (COZAAR) 25 MG tablet Take 25 mg by mouth daily.    [provider]  metFORMIN (GLUCOPHAGE-XR) 500 MG 24 hr tablet TK 1 T PO QD 09/13/14   [provider]  predniSONE (DELTASONE) 20 MG tablet Take 1 tablet (20 mg total) by mouth daily. 09/15/17   Norval Gable, MD  triamcinolone cream (KENALOG) 0.1 % Apply 1 application topically 2 (two) times daily. 09/15/17   Norval Gable, MD    Family History Family History  Problem Relation Age of Onset  . Cancer Maternal Grandmother        breast  . Breast cancer Maternal Grandmother   . Cancer Sister        breast age 23"s  . Breast cancer Sister   . Cancer Maternal Aunt        breast  . Breast cancer Maternal Aunt   . Breast cancer Cousin        mat cousin    Social History Social History   Tobacco Use  . Smoking status: Former Smoker    Types: Cigarettes    Last attempt to quit: 04/21/2011    Years since quitting: 6.4  . Smokeless tobacco: Never Used  Substance Use Topics  . Alcohol use: No    Alcohol/week: 0.0 oz    Comment: rare  . Drug use: No     Allergies   Vicodin [hydrocodone-acetaminophen]   Review of Systems Review of Systems  Skin: Positive for rash.     Physical Exam Triage Vital Signs ED Triage Vitals  Enc Vitals Group     BP 09/15/17 1410 (!) 156/91     Pulse Rate 09/15/17 1410 80     Resp 09/15/17 1410 18     Temp 09/15/17 1410 98.5 F (36.9 C)     Temp Source 09/15/17 1410 Oral     SpO2 09/15/17 1410 98 %      Weight 09/15/17 1411 189 lb (85.7 kg)     Height 09/15/17 1411 5\' 5"  (1.651 m)     Head Circumference --      Peak Flow --      Pain Score 09/15/17 1411 0     Pain Loc --      Pain Edu? --      Excl. in [place]? --    No data found.  Updated Vital Signs BP (!) 156/91 (BP Location: Right Arm)   Pulse 80   Temp 98.5 F (36.9 C) (Oral)   Resp 18   Ht 5\' 5"  (1.651 m)   Wt 189 lb (85.7 kg)   SpO2 98%   BMI 31.45 kg/m   Visual Acuity Right Eye Distance:   Left Eye Distance:   Bilateral Distance:    Right Eye Near:   Left Eye Near:    Bilateral Near:     Physical Exam  Constitutional: She appears well-developed and well-nourished. No distress.  HENT:  Head: Normocephalic and atraumatic.  Right Ear: Tympanic membrane, external ear and ear canal normal.  Left Ear: Tympanic membrane, external ear and ear canal normal.  Nose: Mucosal edema and rhinorrhea present. No nose lacerations, sinus tenderness, nasal deformity, septal deviation or nasal septal hematoma. No epistaxis.  No foreign bodies. Right sinus exhibits no maxillary sinus tenderness and no frontal sinus tenderness. Left sinus exhibits no maxillary sinus tenderness and no frontal sinus tenderness.  Mouth/Throat: Uvula is midline, oropharynx is clear and moist and mucous membranes are normal. No oropharyngeal exudate.  Eyes: Pupils are equal, round, and reactive to light. Conjunctivae and EOM are normal. Right eye exhibits no discharge. Left eye exhibits no discharge. No scleral icterus.  Neck: Normal range of motion. Neck supple. No thyromegaly present.  Cardiovascular: Normal rate, regular rhythm and normal heart sounds.  Pulmonary/Chest: Effort normal and breath sounds normal. No stridor. No respiratory distress. She has no wheezes. She has no rales.  Lymphadenopathy:    She has no cervical adenopathy.  Skin: She is not diaphoretic.  2  healing 1cm skin lesions on left upper thigh; no drainage  Nursing note and vitals  reviewed.    UC Treatments / Results  Labs (all labs ordered are listed, but only abnormal results are displayed) Labs Reviewed - No data to display  EKG None  Radiology No results found.  Procedures Procedures (including critical care time)  Medications Ordered in UC Medications - No data to display  Initial Impression / Assessment and Plan / UC Course  I have reviewed the triage vital signs and the nursing notes.  Pertinent labs & imaging results that were available during my care of the patient were reviewed by me and considered in my medical decision making (see chart for details).      Final Clinical Impressions(s) / UC Diagnoses   Final diagnoses:  Cough  Itching     Discharge Instructions     Claritin     ED Prescriptions    Medication Sig Dispense Auth. Provider   predniSONE (DELTASONE) 20 MG tablet Take 1 tablet (20 mg total) by mouth daily. 5 tablet Destin Kittler, Linward Foster, MD   triamcinolone cream (KENALOG) 0.1 % Apply 1 application topically 2 (two) times daily. 70 g Norval Gable, MD      1. diagnosis reviewed with patient 2. rx as per orders above; reviewed possible side effects, interactions, risks and benefits  3. Recommend supportive treatment with otc antihistamines prn for itching 4. Follow-up prn if symptoms worsen or don't improve Controlled Substance Prescriptions [place] Controlled Substance Registry consulted? Not Applicable   Norval Gable, MD 09/15/17 [phone]

## 2017-09-15 NOTE — Discharge Instructions (Signed)
Claritin

## 2017-09-15 NOTE — ED Triage Notes (Signed)
Pt with itchy, red area on her left hip last week. "I am worried it's a staph infection." Pt was also seen for a cough in the past few weeks but still coughing up phlegm (clear).

## 2018-01-26 ENCOUNTER — Encounter: Payer: Self-pay | Admitting: Emergency Medicine

## 2018-01-26 ENCOUNTER — Other Ambulatory Visit: Payer: Self-pay

## 2018-01-26 ENCOUNTER — Ambulatory Visit
Admission: EM | Admit: 2018-01-26 | Discharge: 2018-01-26 | Disposition: A | Discharge status: Home / Self Care | Payer: Medicare Other | Attending: Family Medicine | Admitting: Family Medicine

## 2018-01-26 DIAGNOSIS — Z79899 Other long term (current) drug therapy: Secondary | ICD-10-CM | POA: Diagnosis not present

## 2018-01-26 DIAGNOSIS — J45909 Unspecified asthma, uncomplicated: Secondary | ICD-10-CM | POA: Diagnosis not present

## 2018-01-26 DIAGNOSIS — Z7982 Long term (current) use of aspirin: Secondary | ICD-10-CM | POA: Diagnosis not present

## 2018-01-26 DIAGNOSIS — K219 Gastro-esophageal reflux disease without esophagitis: Secondary | ICD-10-CM | POA: Insufficient documentation

## 2018-01-26 DIAGNOSIS — Z87891 Personal history of nicotine dependence: Secondary | ICD-10-CM | POA: Diagnosis not present

## 2018-01-26 DIAGNOSIS — N6452 Nipple discharge: Secondary | ICD-10-CM | POA: Diagnosis not present

## 2018-01-26 DIAGNOSIS — J0100 Acute maxillary sinusitis, unspecified: Secondary | ICD-10-CM | POA: Diagnosis not present

## 2018-01-26 DIAGNOSIS — R51 Headache: Secondary | ICD-10-CM | POA: Diagnosis present

## 2018-01-26 DIAGNOSIS — E119 Type 2 diabetes mellitus without complications: Secondary | ICD-10-CM | POA: Insufficient documentation

## 2018-01-26 DIAGNOSIS — Z885 Allergy status to narcotic agent status: Secondary | ICD-10-CM | POA: Diagnosis not present

## 2018-01-26 DIAGNOSIS — Z7984 Long term (current) use of oral hypoglycemic drugs: Secondary | ICD-10-CM | POA: Diagnosis not present

## 2018-01-26 DIAGNOSIS — I10 Essential (primary) hypertension: Secondary | ICD-10-CM | POA: Insufficient documentation

## 2018-01-26 MED ORDER — AMOXICILLIN 875 MG PO TABS: 875.0000 mg | ORAL_TABLET | Freq: Two times a day (BID) | ORAL | 0 refills | Status: DC

## 2018-01-26 NOTE — ED Triage Notes (Signed)
Patient c/o right sided HA off an since yesterday.  Patient c/o discharge from her left breast that started couple of days ago.  Patient reports some discomfort in her left breast.

## 2018-01-26 NOTE — Discharge Instructions (Signed)
Recommend follow up with primary care provider and breast specialist for further evaluation and management of left breast discharge

## 2018-01-26 NOTE — ED Provider Notes (Signed)
[facility]    CSN: [id number] Arrival date & time: 01/26/18  1313     History   Chief Complaint Chief Complaint  Patient presents with  . Headache  . Breast Discharge    left    HPI Brenda Lin is a 67 y.o. female.   The history is provided by the patient.  Headache  Associated symptoms: congestion, facial pain and URI   URI  Presenting symptoms: congestion, facial pain and rhinorrhea   Severity:  Moderate Onset quality:  Sudden Duration:  1 week Timing:  Constant Progression:  Worsening Chronicity:  New Relieved by:  None tried Ineffective treatments:  None tried Associated symptoms: headaches and sinus pain   Associated symptoms: no wheezing   Risk factors: being elderly, chronic respiratory disease, diabetes mellitus and sick contacts   Risk factors: no chronic cardiac disease, no chronic kidney disease, no immunosuppression, no recent illness and no recent travel     Patient also complains of left breast nipple discharge that she's noticed for the past 2 days as well as slight tenderness to the breast. Denies any lumps. States she gets yearly mammograms.     Past Medical History:  Diagnosis Date  . Acid reflux   . Asthma   . Diabetes mellitus without complication ([place])   . Diverticulitis 2013  . Hypertension     Patient Active Problem List   Diagnosis Date Noted  . Nipple discharge 10/03/2014    Past Surgical History:  Procedure Laterality Date  . APPENDECTOMY    . COLON RESECTION  2013   Dr Marina Gravel  . COLONOSCOPY  2015   Dr Ernst Breach  . HERNIA REPAIR  2013  . SHOULDER ARTHROSCOPY W/ ROTATOR CUFF REPAIR Left 2016    OB History    Gravida  2   Para  2   Term      Preterm      AB      Living  2     SAB      TAB      Ectopic      Multiple      Live Births           Obstetric Comments  1st Menstrual Cycle:  13 1st Pregnancy:  20          Home Medications    Prior to Admission medications     Medication Sig Start Date End Date Taking? Authorizing Provider  albuterol (PROVENTIL HFA;VENTOLIN HFA) 108 (90 Base) MCG/ACT inhaler Inhale 2 puffs into the lungs every 4 (four) hours as needed for wheezing or shortness of breath. 2/70/35  Yes Defelice, Jeanett Schlein, NP  allopurinol (ZYLOPRIM) 100 MG tablet Take 100 mg by mouth daily.   Yes [provider]  aspirin 325 MG EC tablet Take 325 mg by mouth daily.   Yes [provider]  fluticasone (FLONASE) 50 MCG/ACT nasal spray as needed.  10/01/14  Yes [provider]  losartan (COZAAR) 25 MG tablet Take 25 mg by mouth daily.   Yes [provider]  metFORMIN (GLUCOPHAGE-XR) 500 MG 24 hr tablet TK 1 T PO QD 09/13/14  Yes [provider]  ACCU-CHEK AVIVA PLUS test strip  07/11/14   [provider]  amoxicillin (AMOXIL) 875 MG tablet Take 1 tablet (875 mg total) by mouth 2 (two) times daily. 01/26/18   Norval Gable, MD  benzonatate (TESSALON) 200 MG capsule Take one cap TID PRN cough 08/23/17   Lorin Picket,  PA-C  colchicine 0.6 MG tablet Take 2 tablets (1.2 mg) initially. Repeat with 1 tablet( 0.6 mg) in one hour. Do not take anymore on the first day. On second day start taking 1 daily until gout attack has resolved. 09/08/16   Lorin Picket, PA-C  guaiFENesin-codeine (CHERATUSSIN AC) 100-10 MG/5ML syrup Take 5 mLs by mouth 3 (three) times daily as needed for cough. 08/23/17   Lorin Picket, PA-C  predniSONE (DELTASONE) 20 MG tablet Take 1 tablet (20 mg total) by mouth daily. 09/15/17   Norval Gable, MD  triamcinolone cream (KENALOG) 0.1 % Apply 1 application topically 2 (two) times daily. 09/15/17   Norval Gable, MD    Family History Family History  Problem Relation Age of Onset  . Cancer Maternal Grandmother        breast  . Breast cancer Maternal Grandmother   . Cancer Sister        breast age 10"s  . Breast cancer Sister   . Cancer Maternal Aunt        breast  . Breast cancer  Maternal Aunt   . Breast cancer Cousin        mat cousin    Social History Social History   Tobacco Use  . Smoking status: Former Smoker    Types: Cigarettes    Last attempt to quit: 04/21/2011    Years since quitting: 6.7  . Smokeless tobacco: Never Used  Substance Use Topics  . Alcohol use: No    Alcohol/week: 0.0 standard drinks    Comment: rare  . Drug use: No     Allergies   Vicodin [hydrocodone-acetaminophen]   Review of Systems Review of Systems  HENT: Positive for congestion, rhinorrhea and sinus pain.   Respiratory: Negative for wheezing.   Neurological: Positive for headaches.     Physical Exam Triage Vital Signs ED Triage Vitals  Enc Vitals Group     BP 01/26/18 1327 (!) 153/92     Pulse Rate 01/26/18 1327 77     Resp 01/26/18 1327 16     Temp 01/26/18 1327 98.2 F (36.8 C)     Temp Source 01/26/18 1327 Oral     SpO2 01/26/18 1327 98 %     Weight 01/26/18 1324 200 lb (90.7 kg)     Height 01/26/18 1324 5\' 5"  (1.651 m)     Head Circumference --      Peak Flow --      Pain Score 01/26/18 1324 8     Pain Loc --      Pain Edu? --      Excl. in [place]? --    No data found.  Updated Vital Signs BP (!) 153/92 (BP Location: Right Arm)   Pulse 77   Temp 98.2 F (36.8 C) (Oral)   Resp 16   Ht 5\' 5"  (1.651 m)   Wt 90.7 kg   SpO2 98%   BMI 33.28 kg/m   Visual Acuity Right Eye Distance:   Left Eye Distance:   Bilateral Distance:    Right Eye Near:   Left Eye Near:    Bilateral Near:     Physical Exam  Constitutional: She appears well-developed and well-nourished. No distress.  HENT:  Head: Normocephalic and atraumatic.  Right Ear: Tympanic membrane, external ear and ear canal normal.  Left Ear: Tympanic membrane, external ear and ear canal normal.  Nose: Mucosal edema and rhinorrhea present. No nose lacerations, sinus tenderness, nasal deformity, septal deviation or  nasal septal hematoma. No epistaxis.  No foreign bodies. Right sinus exhibits  maxillary sinus tenderness and frontal sinus tenderness. Left sinus exhibits maxillary sinus tenderness and frontal sinus tenderness.  Mouth/Throat: Uvula is midline, oropharynx is clear and moist and mucous membranes are normal. No oropharyngeal exudate.  Eyes: Conjunctivae are normal. Right eye exhibits no discharge. Left eye exhibits no discharge. No scleral icterus.  Neck: Normal range of motion. Neck supple. No thyromegaly present.  Cardiovascular: Normal rate, regular rhythm and normal heart sounds.  Pulmonary/Chest: Effort normal and breath sounds normal. No stridor. No respiratory distress. She has no wheezes. She has no rales. Right breast exhibits no inverted nipple, no mass, no nipple discharge, no skin change and no tenderness. Left breast exhibits nipple discharge and tenderness. Left breast exhibits no inverted nipple, no mass and no skin change.  Lymphadenopathy:    She has no cervical adenopathy.  Skin: She is not diaphoretic.  Nursing note and vitals reviewed.    UC Treatments / Results  Labs (all labs ordered are listed, but only abnormal results are displayed) Labs Reviewed  AEROBIC CULTURE (SUPERFICIAL SPECIMEN)    EKG None  Radiology No results found.  Procedures Procedures (including critical care time)  Medications Ordered in UC Medications - No data to display  Initial Impression / Assessment and Plan / UC Course  I have reviewed the triage vital signs and the nursing notes.  Pertinent labs & imaging results that were available during my care of the patient were reviewed by me and considered in my medical decision making (see chart for details).      Final Clinical Impressions(s) / UC Diagnoses   Final diagnoses:  Acute maxillary sinusitis, recurrence not specified  Discharge from breast     Discharge Instructions     Recommend follow up with primary care provider and breast specialist for further evaluation and management of left breast  discharge    ED Prescriptions    Medication Sig Dispense Auth. Provider   amoxicillin (AMOXIL) 875 MG tablet Take 1 tablet (875 mg total) by mouth 2 (two) times daily. 20 tablet Norval Gable, MD     1. diagnosis reviewed with patient 2. rx as per orders above; reviewed possible side effects, interactions, risks and benefits  3. Recommend patient follow up with breast general surgeon for further evaluation of unilateral breast discharge; patient given information  4. Culture done of breast discharge 5. Follow up with PCP 6.  Follow-up prn if symptoms worsen or don't improve  Controlled Substance Prescriptions [place] Controlled Substance Registry consulted? Not Applicable   Norval Gable, MD 01/26/18 1524

## 2018-01-28 ENCOUNTER — Other Ambulatory Visit: Payer: Self-pay | Admitting: Physician Assistant

## 2018-01-28 DIAGNOSIS — Z1231 Encounter for screening mammogram for malignant neoplasm of breast: Secondary | ICD-10-CM

## 2018-01-29 LAB — AEROBIC CULTURE W GRAM STAIN (SUPERFICIAL SPECIMEN)

## 2018-01-29 LAB — AEROBIC CULTURE  (SUPERFICIAL SPECIMEN): CULTURE: NORMAL

## 2018-01-30 ENCOUNTER — Other Ambulatory Visit: Payer: Self-pay

## 2018-01-30 ENCOUNTER — Ambulatory Visit
Admission: EM | Admit: 2018-01-30 | Discharge: 2018-01-30 | Disposition: A | Discharge status: Home / Self Care | Payer: Medicare Other | Attending: Family Medicine | Admitting: Family Medicine

## 2018-01-30 DIAGNOSIS — G43909 Migraine, unspecified, not intractable, without status migrainosus: Secondary | ICD-10-CM | POA: Diagnosis not present

## 2018-01-30 MED ORDER — KETOROLAC TROMETHAMINE 60 MG/2ML IM SOLN
30.0000 mg | Freq: Once | INTRAMUSCULAR | Status: AC
  Administered 2018-01-30: 30 mg via INTRAMUSCULAR

## 2018-01-30 NOTE — ED Triage Notes (Signed)
Pt reports she was seen earlier in the week and diagnosed with sinusitis. Taking ABX but headache not improving.

## 2018-01-30 NOTE — ED Provider Notes (Signed)
[facility]    CSN: [id number] Arrival date & time: 01/30/18  1426  History   Chief Complaint Chief Complaint  Patient presents with  . Headache   HPI  67 year old female presents with headache.  Patient endorses a prior history of migraine.  Patient states that this feels like a migraine.  She feels that her sinusitis is also contributing.  She is currently on amoxicillin for sinusitis.  Patient states that the headache is located posteriorly and frontally.  She reports that the headache is moderate to severe.  She is had no relief with over-the-counter medications.  She reports associated photophobia.  She states that this is consistent with her prior migraines.  Patient has responded to Toradol will previously and is requesting Toradol today.  Past Medical History:  Diagnosis Date  . Acid reflux   . Asthma   . Diabetes mellitus without complication ([place])   . Diverticulitis 2013  . Hypertension    Patient Active Problem List   Diagnosis Date Noted  . Nipple discharge 10/03/2014   Past Surgical History:  Procedure Laterality Date  . APPENDECTOMY    . COLON RESECTION  2013   Dr Marina Gravel  . COLONOSCOPY  2015   Dr Ernst Breach  . HERNIA REPAIR  2013  . SHOULDER ARTHROSCOPY W/ ROTATOR CUFF REPAIR Left 2016   OB History    Gravida  2   Para  2   Term      Preterm      AB      Living  2     SAB      TAB      Ectopic      Multiple      Live Births           Obstetric Comments  1st Menstrual Cycle:  13 1st Pregnancy:  20        Home Medications    Prior to Admission medications   Medication Sig Start Date End Date Taking? Authorizing Provider  ACCU-CHEK AVIVA PLUS test strip  07/11/14   [provider]  albuterol (PROVENTIL HFA;VENTOLIN HFA) 108 (90 Base) MCG/ACT inhaler Inhale 2 puffs into the lungs every 4 (four) hours as needed for wheezing or shortness of breath. 6/43/32   Defelice, Jeanett Schlein, NP  allopurinol (ZYLOPRIM) 100 MG  tablet Take 100 mg by mouth daily.    [provider]  amoxicillin (AMOXIL) 875 MG tablet Take 1 tablet (875 mg total) by mouth 2 (two) times daily. 01/26/18   Norval Gable, MD  aspirin 325 MG EC tablet Take 325 mg by mouth daily.    [provider]  benzonatate (TESSALON) 200 MG capsule Take one cap TID PRN cough 08/23/17   Lorin Picket, PA-C  colchicine 0.6 MG tablet Take 2 tablets (1.2 mg) initially. Repeat with 1 tablet( 0.6 mg) in one hour. Do not take anymore on the first day. On second day start taking 1 daily until gout attack has resolved. 09/08/16   Lorin Picket, PA-C  fluticasone (FLONASE) 50 MCG/ACT nasal spray as needed.  10/01/14   [provider]  losartan (COZAAR) 25 MG tablet Take 25 mg by mouth daily.    [provider]  metFORMIN (GLUCOPHAGE-XR) 500 MG 24 hr tablet TK 1 T PO QD 09/13/14   [provider]    Family History Family History  Problem Relation Age of Onset  . Cancer Maternal Grandmother        breast  .  Breast cancer Maternal Grandmother   . Cancer Sister        breast age 57"s  . Breast cancer Sister   . Cancer Maternal Aunt        breast  . Breast cancer Maternal Aunt   . Breast cancer Cousin        mat cousin    Social History Social History   Tobacco Use  . Smoking status: Former Smoker    Types: Cigarettes    Last attempt to quit: 04/21/2011    Years since quitting: 6.7  . Smokeless tobacco: Never Used  Substance Use Topics  . Alcohol use: No    Alcohol/week: 0.0 standard drinks    Comment: rare  . Drug use: No     Allergies   Vicodin [hydrocodone-acetaminophen]   Review of Systems Review of Systems  Eyes: Positive for photophobia.  Neurological: Positive for headaches.   Physical Exam Triage Vital Signs ED Triage Vitals  Enc Vitals Group     BP 01/30/18 1431 (!) 166/91     Pulse Rate 01/30/18 1431 78     Resp 01/30/18 1431 16     Temp 01/30/18 1431 98.2 F (36.8 C)      Temp Source 01/30/18 1431 Oral     SpO2 01/30/18 1431 99 %     Weight 01/30/18 1434 199 lb 15.3 oz (90.7 kg)     Height 01/30/18 1434 5\' 5"  (1.651 m)     Head Circumference --      Peak Flow --      Pain Score 01/30/18 1432 10     Pain Loc --      Pain Edu? --      Excl. in [place]? --    Updated Vital Signs BP (!) 166/91 (BP Location: Left Arm)   Pulse 78   Temp 98.2 F (36.8 C) (Oral)   Resp 16   Ht 5\' 5"  (1.651 m)   Wt 90.7 kg   SpO2 99%   BMI 33.27 kg/m   Visual Acuity Right Eye Distance:   Left Eye Distance:   Bilateral Distance:    Right Eye Near:   Left Eye Near:    Bilateral Near:     Physical Exam  Constitutional: She is oriented to person, place, and time. She appears well-developed. No distress.  Eyes: Pupils are equal, round, and reactive to light. Conjunctivae are normal.  Cardiovascular: Normal rate and regular rhythm.  Pulmonary/Chest: Effort normal and breath sounds normal. She has no wheezes. She has no rales.  Neurological: She is alert and oriented to person, place, and time.  Psychiatric: She has a normal mood and affect. Her behavior is normal.  Nursing note and vitals reviewed.  UC Treatments / Results  Labs (all labs ordered are listed, but only abnormal results are displayed) Labs Reviewed - No data to display  EKG None  Radiology No results found.  Procedures Procedures (including critical care time)  Medications Ordered in UC Medications  ketorolac (TORADOL) injection 30 mg (30 mg Intramuscular Given 01/30/18 1445)    Initial Impression / Assessment and Plan / UC Course  I have reviewed the triage vital signs and the nursing notes.  Pertinent labs & imaging results that were available during my care of the patient were reviewed by me and considered in my medical decision making (see chart for details).    67 year old female presents with migraine.  Toradol given here. This medication is not recommended in the area  according to the  [place] list.  However, renal function has been stable and she would not be taking this chronically.  Low risk for GI bleed or other side effects.  I feel that a one-time dose is a reasonable treatment option.   Final Clinical Impressions(s) / UC Diagnoses   Final diagnoses:  Migraine without status migrainosus, not intractable, unspecified migraine type     Discharge Instructions     Rest.  Tylenol as needed.  Finish antibiotic.  Take care  Dr. Lacinda Axon    ED Prescriptions    None     Controlled Substance Prescriptions [place] Controlled Substance Registry consulted? Not Applicable   Coral Spikes, DO 01/30/18 1508

## 2018-01-30 NOTE — Discharge Instructions (Signed)
Rest.  Tylenol as needed.  Finish antibiotic.  Take care  Dr. Lacinda Axon

## 2018-02-03 ENCOUNTER — Ambulatory Visit
Admission: EM | Admit: 2018-02-03 | Discharge: 2018-02-03 | Disposition: A | Discharge status: Home / Self Care | Payer: Medicare Other | Attending: Family Medicine | Admitting: Family Medicine

## 2018-02-03 ENCOUNTER — Ambulatory Visit
Admit: 2018-02-03 | Discharge: 2018-02-03 | Disposition: A | Discharge status: Home / Self Care | Payer: Medicare Other | Attending: Family Medicine | Admitting: Family Medicine

## 2018-02-03 ENCOUNTER — Encounter: Payer: Self-pay | Admitting: Emergency Medicine

## 2018-02-03 ENCOUNTER — Other Ambulatory Visit: Payer: Self-pay

## 2018-02-03 DIAGNOSIS — Z87891 Personal history of nicotine dependence: Secondary | ICD-10-CM | POA: Insufficient documentation

## 2018-02-03 DIAGNOSIS — Z792 Long term (current) use of antibiotics: Secondary | ICD-10-CM | POA: Insufficient documentation

## 2018-02-03 DIAGNOSIS — R51 Headache: Secondary | ICD-10-CM | POA: Diagnosis not present

## 2018-02-03 DIAGNOSIS — Z885 Allergy status to narcotic agent status: Secondary | ICD-10-CM | POA: Insufficient documentation

## 2018-02-03 DIAGNOSIS — Z79899 Other long term (current) drug therapy: Secondary | ICD-10-CM | POA: Diagnosis not present

## 2018-02-03 DIAGNOSIS — R42 Dizziness and giddiness: Secondary | ICD-10-CM | POA: Diagnosis not present

## 2018-02-03 DIAGNOSIS — Z7984 Long term (current) use of oral hypoglycemic drugs: Secondary | ICD-10-CM | POA: Insufficient documentation

## 2018-02-03 DIAGNOSIS — J45909 Unspecified asthma, uncomplicated: Secondary | ICD-10-CM | POA: Diagnosis not present

## 2018-02-03 DIAGNOSIS — R519 Headache, unspecified: Secondary | ICD-10-CM

## 2018-02-03 DIAGNOSIS — Z7982 Long term (current) use of aspirin: Secondary | ICD-10-CM | POA: Insufficient documentation

## 2018-02-03 DIAGNOSIS — E119 Type 2 diabetes mellitus without complications: Secondary | ICD-10-CM | POA: Insufficient documentation

## 2018-02-03 DIAGNOSIS — I10 Essential (primary) hypertension: Secondary | ICD-10-CM | POA: Insufficient documentation

## 2018-02-03 DIAGNOSIS — Z9889 Other specified postprocedural states: Secondary | ICD-10-CM | POA: Insufficient documentation

## 2018-02-03 DIAGNOSIS — Z23 Encounter for immunization: Secondary | ICD-10-CM | POA: Diagnosis not present

## 2018-02-03 MED ORDER — BUTALBITAL-APAP-CAFFEINE 50-325-40 MG PO TABS: 1.0000 | ORAL_TABLET | Freq: Four times a day (QID) | ORAL | 0 refills | Status: DC | PRN

## 2018-02-03 NOTE — Discharge Instructions (Signed)
Ct negative.  Monitor BP. Consider restarting Amlodipine.  Use the headache medication as directed.  Follow up with your PCP.  Take care  Dr. Lacinda Axon

## 2018-02-03 NOTE — ED Provider Notes (Signed)
[facility]   CSN: [id number] Arrival date & time: 02/03/18  1406  History   Chief Complaint Chief Complaint  Patient presents with  . Headache   HPI  67 year old female presents with persistent headache.  I recently saw the patient on 10/13.  Patient endorsed a prior history of migraines years ago.  I treated the patient with Toradol on 10/13.  She states that she had improvement but then she had a recurrence.  Her pain is located in the frontal and occipital regions.  She also reports right ear pain.  Associated nausea and mild photophobia.  No other medications or interventions tried.  No reports of weakness or other neurological symptoms.  No vision changes.  No other associated symptoms.  No other complaints.    PMH, Surgical Hx, Family Hx, Social History reviewed and updated as below.  Past Medical History:  Diagnosis Date  . Acid reflux   . Asthma   . Diabetes mellitus without complication ([place])   . Diverticulitis 2013  . Hypertension     Patient Active Problem List   Diagnosis Date Noted  . Nipple discharge 10/03/2014    Past Surgical History:  Procedure Laterality Date  . APPENDECTOMY    . COLON RESECTION  2013   Dr Marina Gravel  . COLONOSCOPY  2015   Dr Ernst Breach  . HERNIA REPAIR  2013  . SHOULDER ARTHROSCOPY W/ ROTATOR CUFF REPAIR Left 2016    OB History    Gravida  2   Para  2   Term      Preterm      AB      Living  2     SAB      TAB      Ectopic      Multiple      Live Births           Obstetric Comments  1st Menstrual Cycle:  13 1st Pregnancy:  20          Home Medications    Prior to Admission medications   Medication Sig Start Date End Date Taking? Authorizing Provider  albuterol (PROVENTIL HFA;VENTOLIN HFA) 108 (90 Base) MCG/ACT inhaler Inhale 2 puffs into the lungs every 4 (four) hours as needed for wheezing or shortness of breath. 4/00/86  Yes Defelice, Jeanett Schlein, NP  allopurinol (ZYLOPRIM) 100 MG tablet Take  100 mg by mouth daily.   Yes [provider]  amoxicillin (AMOXIL) 875 MG tablet Take 1 tablet (875 mg total) by mouth 2 (two) times daily. 01/26/18  Yes Norval Gable, MD  colchicine 0.6 MG tablet Take 2 tablets (1.2 mg) initially. Repeat with 1 tablet( 0.6 mg) in one hour. Do not take anymore on the first day. On second day start taking 1 daily until gout attack has resolved. 09/08/16  Yes Lorin Picket, PA-C  fluticasone (FLONASE) 50 MCG/ACT nasal spray as needed.  10/01/14  Yes [provider]  losartan (COZAAR) 25 MG tablet Take 25 mg by mouth daily.   Yes [provider]  metFORMIN (GLUCOPHAGE-XR) 500 MG 24 hr tablet TK 1 T PO QD 09/13/14  Yes [provider]  ACCU-CHEK AVIVA PLUS test strip  07/11/14   [provider]  aspirin 325 MG EC tablet Take 325 mg by mouth daily.    [provider]  butalbital-acetaminophen-caffeine (FIORICET, ESGIC) 50-325-40 MG tablet Take 1 tablet by mouth every 6 (six) hours as needed for headache. 02/03/18 02/03/19  Coral Spikes,  DO    Family History Family History  Problem Relation Age of Onset  . Cancer Maternal Grandmother        breast  . Breast cancer Maternal Grandmother   . Cancer Sister        breast age 63"s  . Breast cancer Sister   . Cancer Maternal Aunt        breast  . Breast cancer Maternal Aunt   . Breast cancer Cousin        mat cousin    Social History Social History   Tobacco Use  . Smoking status: Former Smoker    Types: Cigarettes    Last attempt to quit: 04/21/2011    Years since quitting: 6.7  . Smokeless tobacco: Never Used  Substance Use Topics  . Alcohol use: Yes    Alcohol/week: 0.0 standard drinks    Comment: rare  . Drug use: No     Allergies   Vicodin [hydrocodone-acetaminophen]   Review of Systems Review of Systems  Eyes: Positive for photophobia. Negative for visual disturbance.  Gastrointestinal: Positive for nausea.  Neurological:        Headache.   Physical Exam Triage Vital Signs ED Triage Vitals  Enc Vitals Group     BP 02/03/18 1415 (!) 146/118     Pulse Rate 02/03/18 1415 79     Resp 02/03/18 1415 16     Temp 02/03/18 1415 98.2 F (36.8 C)     Temp Source 02/03/18 1415 Oral     SpO2 02/03/18 1415 98 %     Weight 02/03/18 1416 200 lb (90.7 kg)     Height 02/03/18 1416 5\' 5"  (1.651 m)     Head Circumference --      Peak Flow --      Pain Score 02/03/18 1415 10     Pain Loc --      Pain Edu? --      Excl. in [place]? --    Updated Vital Signs BP (!) 146/118 (BP Location: Left Arm)   Pulse 79   Temp 98.2 F (36.8 C) (Oral)   Resp 16   Ht 5\' 5"  (1.651 m)   Wt 90.7 kg   SpO2 98%   BMI 33.28 kg/m   Visual Acuity Right Eye Distance:   Left Eye Distance:   Bilateral Distance:    Right Eye Near:   Left Eye Near:    Bilateral Near:     Physical Exam  Constitutional: She is oriented to person, place, and time. She appears well-developed. No distress.  HENT:  Head: Atraumatic.  Mouth/Throat: Oropharynx is clear and moist.  Eyes: Pupils are equal, round, and reactive to light. Conjunctivae and EOM are normal.  Cardiovascular: Normal rate and regular rhythm.  Pulmonary/Chest: Effort normal. No respiratory distress.  Neurological: She is alert and oriented to person, place, and time. No cranial nerve deficit. She exhibits normal muscle tone.  Normal muscle strength.   Psychiatric: She has a normal mood and affect. Her behavior is normal.  Nursing note and vitals reviewed.  UC Treatments / Results  Labs (all labs ordered are listed, but only abnormal results are displayed) Labs Reviewed - No data to display  EKG None  Radiology Ct Head Wo Contrast  Result Date: 02/03/2018 CLINICAL DATA:  Headache since 01/26/2018 some dizziness. Pain worse on the right. EXAM: CT HEAD WITHOUT CONTRAST TECHNIQUE: Contiguous axial images were obtained from the base of the skull through the vertex without intravenous  contrast. COMPARISON:  None. FINDINGS: Brain: Mild age related volume loss. No evidence of old or acute small or large vessel infarction, mass lesion, hemorrhage, hydrocephalus or extra-axial collection. Vascular: There is atherosclerotic calcification of the major vessels at the base of the brain. Skull: Negative Sinuses/Orbits: Clear/normal Other: None IMPRESSION: Normal head CT for age. No abnormality seen to explain the presenting symptoms. Electronically Signed   By: Nelson Chimes M.D.   On: 02/03/2018 14:54    Procedures Procedures (including critical care time)  Medications Ordered in UC Medications - No data to display  Initial Impression / Assessment and Plan / UC Course  I have reviewed the triage vital signs and the nursing notes.  Pertinent labs & imaging results that were available during my care of the patient were reviewed by me and considered in my medical decision making (see chart for details).    67 year old female presents with persistent headaches.  Given age and comorbidity, I obtained a CT which was negative.  Treating with Fioricet.  Advised to follow-up closely with her primary care physician.  Consider restarting her home amlodipine given the fact that her blood pressure continues to be elevated.  Final Clinical Impressions(s) / UC Diagnoses   Final diagnoses:  Acute nonintractable headache, unspecified headache type     Discharge Instructions     Ct negative.  Monitor BP. Consider restarting Amlodipine.  Use the headache medication as directed.  Follow up with your PCP.  Take care  Dr. Lacinda Axon     ED Prescriptions    Medication Sig Dispense Auth. Provider   butalbital-acetaminophen-caffeine (FIORICET, ESGIC) 50-325-40 MG tablet Take 1 tablet by mouth every 6 (six) hours as needed for headache. 20 tablet Coral Spikes, DO     Controlled Substance Prescriptions [place] Controlled Substance Registry consulted? Not Applicable   Coral Spikes, DO 02/03/18  1504

## 2018-02-03 NOTE — ED Triage Notes (Signed)
Patient in today c/o continued headache since 01/26/18. Patient was seen on 10/9 and given antibiotics and again on 01/30/18 when she was given an injection.

## 2018-02-08 ENCOUNTER — Other Ambulatory Visit: Payer: Self-pay

## 2018-02-08 ENCOUNTER — Encounter: Payer: Self-pay | Admitting: General Surgery

## 2018-02-08 ENCOUNTER — Ambulatory Visit (INDEPENDENT_AMBULATORY_CARE_PROVIDER_SITE_OTHER): Payer: Medicare Other | Admitting: General Surgery

## 2018-02-08 VITALS — BP 138/72 | HR 88 | Resp 14 | Ht 65.0 in | Wt 196.0 lb

## 2018-02-08 DIAGNOSIS — Z1231 Encounter for screening mammogram for malignant neoplasm of breast: Secondary | ICD-10-CM | POA: Diagnosis not present

## 2018-02-08 DIAGNOSIS — N6452 Nipple discharge: Secondary | ICD-10-CM

## 2018-02-08 NOTE — Progress Notes (Signed)
Patient ID: Brenda Lin, female   DOB: 27-Jun-1950, 67 y.o.   MRN: [id number]  Chief Complaint  Patient presents with  . Breast Problem    HPI Brenda Lin is a 67 y.o. female.  Here for evaluation of a possible left breast abscess. She went to the walk in clinic for a sinus infection and told them about the breast  She states it started first part of October. She went to the walk in clinic 3 times due to her sinus headaches. She states she had some left breast pain "nagging pain" with nipple discharge that had an odor. No redness. She was given an antibiotic and completed Augmentin 875 on Friday. She states things a better now. No nipple discharge from 2016 until recent. It is time for her mammograms. Completed Augmentin 875 on Friday. 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  HPI  Past Medical History:  Diagnosis Date  . Acid reflux   . Asthma   . Diabetes mellitus without complication ([place])   . Diverticulitis 2013  . Hypertension     Past Surgical History:  Procedure Laterality Date  . APPENDECTOMY    . COLON RESECTION  2013   Dr Marina Gravel  . COLONOSCOPY  2015   Dr Ernst Breach  . HERNIA REPAIR  2013  . SHOULDER ARTHROSCOPY W/ ROTATOR CUFF REPAIR Left 2016    Family History  Problem Relation Age of Onset  . Cancer Maternal Grandmother        breast  . Breast cancer Maternal Grandmother   . Breast cancer Sister        5's  . Breast cancer Maternal Aunt   . Breast cancer Cousin        mat cousin    Social History Social History   Tobacco Use  . Smoking status: Former Smoker    Types: Cigarettes    Last attempt to quit: 04/21/2011    Years since quitting: 6.8  . Smokeless tobacco: Never Used  Substance Use Topics  . Alcohol use: Yes    Alcohol/week: 0.0 standard drinks    Comment: rare  . Drug use: No    Allergies  Allergen Reactions  . Vicodin [Hydrocodone-Acetaminophen] Itching    Current Outpatient Medications  Medication Sig  Dispense Refill  . ACCU-CHEK AVIVA PLUS test strip     . albuterol (PROVENTIL HFA;VENTOLIN HFA) 108 (90 Base) MCG/ACT inhaler Inhale 2 puffs into the lungs every 4 (four) hours as needed for wheezing or shortness of breath. 1 Inhaler 0  . allopurinol (ZYLOPRIM) 100 MG tablet Take 100 mg by mouth daily.    Marland Kitchen aspirin 325 MG EC tablet Take 325 mg by mouth daily.    . butalbital-acetaminophen-caffeine (FIORICET, ESGIC) 50-325-40 MG tablet Take 1 tablet by mouth every 6 (six) hours as needed for headache. 20 tablet 0  . colchicine 0.6 MG tablet Take 2 tablets (1.2 mg) initially. Repeat with 1 tablet( 0.6 mg) in one hour. Do not take anymore on the first day. On second day start taking 1 daily until gout attack has resolved. 15 tablet 0  . fluticasone (FLONASE) 50 MCG/ACT nasal spray as needed.     Marland Kitchen losartan (COZAAR) 25 MG tablet Take 25 mg by mouth daily.    . metFORMIN (GLUCOPHAGE-XR) 500 MG 24 hr tablet Take 500 mg by mouth daily with breakfast.   0   No current facility-administered medications for this visit.     Review of Systems  Review of Systems  Constitutional: Negative.   Respiratory: Negative.   Cardiovascular: Negative.     Blood pressure 138/72, pulse 88, resp. rate 14, height 5\' 5"  (1.651 m), weight 196 lb (88.9 kg), SpO2 98 %.  Physical Exam Physical Exam  Constitutional: She is oriented to person, place, and time. She appears well-developed and well-nourished.  HENT:  Mouth/Throat: Oropharynx is clear and moist.  Eyes: Conjunctivae are normal. No scleral icterus.  Neck: Neck supple.  Cardiovascular: Normal rate, regular rhythm and normal heart sounds.  Pulmonary/Chest: Effort normal and breath sounds normal. Right breast exhibits no inverted nipple, no mass, no nipple discharge, no skin change and no tenderness. Left breast exhibits nipple discharge. Left breast exhibits no inverted nipple, no mass, no skin change and no tenderness.  Left nipple crease, chronic.      Lymphadenopathy:    She has no cervical adenopathy.    She has no axillary adenopathy.  Neurological: She is alert and oriented to person, place, and time.  Skin: Skin is warm and dry.  Psychiatric: Her behavior is normal.    Data Reviewed Review of October 02, 2014 office notes shows a similar exam, negative ultrasound for ductal dilatation and nipple crease. Culture showed multiple organisms.  January 26, 2018 culture:  Special Requests LEFT  Performed at [facility], [address] [place], [place] [address]   Gram Stain ABUNDANT WBC PRESENT, PREDOMINANTLY PMN  ABUNDANT GRAM NEGATIVE RODS  MODERATE GRAM POSITIVE COCCI   Culture FEW NORMAL SKIN FLORA     October 02, 2014 culture results:  Result 1 CommentAbnormal    Comment: Acinetobacter calcoaceticus baumannii complex  Heavy growth   Result 2 CommentAbnormal    Comment: Pseudomonas aeruginosa  Moderate growth      Assessment    Asymptomatic nipple drainage, chronic nipple distortion.     Plan     Patient will be asked to have bilateral screening mammogram The patient is aware to call back for any questions or new concerns.     HPI, Physical Exam, Assessment and Plan have been scribed under the direction and in the presence of Robert Bellow, MD. [name], RN  I have completed the exam and reviewed the above documentation for accuracy and completeness.  I agree with the above.  Haematologist has been used and any errors in dictation or transcription are unintentional.  Hervey Ard, M.D., F.A.C.S.  Patient will be asked to have bilateral screening mammogram this is scheduled for 02/21/18 at 3:40 pm at the [facility] Documented by [name] LPN  [name] 02/08/2018, 8:59 PM

## 2018-02-08 NOTE — Patient Instructions (Addendum)
The patient is aware to call back for any questions or new concerns.  Patient will be asked to have bilateral screening mammogram this is scheduled for 02/21/18 at 3:40 pm at the [facility].

## 2018-02-18 HISTORY — PX: BREAST BIOPSY: SHX20

## 2018-02-28 ENCOUNTER — Other Ambulatory Visit: Payer: Self-pay

## 2018-02-28 ENCOUNTER — Ambulatory Visit
Admission: EM | Admit: 2018-02-28 | Discharge: 2018-02-28 | Disposition: A | Discharge status: Home / Self Care | Payer: Medicare Other | Attending: Family Medicine | Admitting: Family Medicine

## 2018-02-28 ENCOUNTER — Encounter: Payer: Self-pay | Admitting: Emergency Medicine

## 2018-02-28 DIAGNOSIS — L03115 Cellulitis of right lower limb: Secondary | ICD-10-CM

## 2018-02-28 DIAGNOSIS — W57XXXA Bitten or stung by nonvenomous insect and other nonvenomous arthropods, initial encounter: Secondary | ICD-10-CM

## 2018-02-28 DIAGNOSIS — S80862A Insect bite (nonvenomous), left lower leg, initial encounter: Secondary | ICD-10-CM | POA: Diagnosis not present

## 2018-02-28 DIAGNOSIS — S80861A Insect bite (nonvenomous), right lower leg, initial encounter: Secondary | ICD-10-CM

## 2018-02-28 MED ORDER — CEPHALEXIN 500 MG PO CAPS: 500.0000 mg | ORAL_CAPSULE | Freq: Three times a day (TID) | ORAL | 0 refills | Status: AC

## 2018-02-28 MED ORDER — MUPIROCIN 2 % EX OINT: TOPICAL_OINTMENT | CUTANEOUS | 0 refills | Status: DC

## 2018-02-28 NOTE — ED Triage Notes (Signed)
Pt c/o has insect bite on her lower right leg and ankle. She reports that it itches. She noticed it about a week ago.

## 2018-02-28 NOTE — ED Provider Notes (Signed)
[facility] ____________________________________________  Time seen: Approximately 12:35 PM  I have reviewed the triage vital signs and the nursing notes.   HISTORY  Chief Complaint Insect Bite (right lower leg)  HPI Brenda Lin is a 67 y.o. female presenting for evaluation of insect bite to bilateral lower extremities, right worse than left.  Patient reports this past week she was outside and noticed that she felt bites to her right leg, and reports that family said it was small black bugs.  States does not believe that with ants.  Denies tick bites.  States the areas are very itchy.  Minimally tender.  Unresolved with topical home cream and hydroxyzine.  Reports otherwise doing well and denies other complaints.  States right lower area has a swollen area that she is concerned of possible secondary infection.  Reports tetanus immunization is up-to-date.  Denies any other skin changes.  No accompanying chest pain or shortness of breath, edema or other rash.     Past Medical History:  Diagnosis Date  . Acid reflux   . Asthma   . Diabetes mellitus without complication ([place])   . Diverticulitis 2013  . Hypertension     Patient Active Problem List   Diagnosis Date Noted  . Nipple discharge 10/03/2014    Past Surgical History:  Procedure Laterality Date  . APPENDECTOMY    . COLON RESECTION  2013   Dr Marina Gravel  . COLONOSCOPY  2015   Dr Ernst Breach  . HERNIA REPAIR  2013  . SHOULDER ARTHROSCOPY W/ ROTATOR CUFF REPAIR Left 2016     No current facility-administered medications for this encounter.   Current Outpatient Medications:  .  ACCU-CHEK AVIVA PLUS test strip, , Disp: , Rfl:  .  albuterol (PROVENTIL HFA;VENTOLIN HFA) 108 (90 Base) MCG/ACT inhaler, Inhale 2 puffs into the lungs every 4 (four) hours as needed for wheezing or shortness of breath., Disp: 1 Inhaler, Rfl: 0 .  allopurinol (ZYLOPRIM) 100 MG tablet, Take 100 mg by mouth daily., Disp: , Rfl:  .  aspirin  325 MG EC tablet, Take 325 mg by mouth daily., Disp: , Rfl:  .  butalbital-acetaminophen-caffeine (FIORICET, ESGIC) 50-325-40 MG tablet, Take 1 tablet by mouth every 6 (six) hours as needed for headache., Disp: 20 tablet, Rfl: 0 .  colchicine 0.6 MG tablet, Take 2 tablets (1.2 mg) initially. Repeat with 1 tablet( 0.6 mg) in one hour. Do not take anymore on the first day. On second day start taking 1 daily until gout attack has resolved., Disp: 15 tablet, Rfl: 0 .  fluticasone (FLONASE) 50 MCG/ACT nasal spray, as needed. , Disp: , Rfl:  .  losartan (COZAAR) 25 MG tablet, Take 25 mg by mouth daily., Disp: , Rfl:  .  metFORMIN (GLUCOPHAGE-XR) 500 MG 24 hr tablet, Take 500 mg by mouth daily with breakfast. , Disp: , Rfl: 0 .  cephALEXin (KEFLEX) 500 MG capsule, Take 1 capsule (500 mg total) by mouth 3 (three) times daily for 7 days., Disp: 21 capsule, Rfl: 0 .  mupirocin ointment (BACTROBAN) 2 %, Apply two times a day for 7 days., Disp: 22 g, Rfl: 0  Allergies Vicodin [hydrocodone-acetaminophen]  Family History  Problem Relation Age of Onset  . Cancer Maternal Grandmother        breast  . Breast cancer Maternal Grandmother   . Breast cancer Sister        30's  . Breast cancer Maternal Aunt   . Breast cancer Cousin  mat cousin    Social History Social History   Tobacco Use  . Smoking status: Former Smoker    Types: Cigarettes    Last attempt to quit: 04/21/2011    Years since quitting: 6.8  . Smokeless tobacco: Never Used  Substance Use Topics  . Alcohol use: Yes    Alcohol/week: 0.0 standard drinks    Comment: rare  . Drug use: No    Review of Systems Constitutional: No fever Cardiovascular: Denies chest pain. Respiratory: Denies shortness of breath. Gastrointestinal: No abdominal pain.   Musculoskeletal: Negative for back pain. Skin: positive for rash.   ____________________________________________   PHYSICAL EXAM:  VITAL SIGNS: ED Triage Vitals  Enc Vitals  Group     BP 02/28/18 1133 (!) 145/86     Pulse Rate 02/28/18 1133 77     Resp 02/28/18 1133 16     Temp 02/28/18 1133 98.2 F (36.8 C)     Temp Source 02/28/18 1133 Oral     SpO2 02/28/18 1133 96 %     Weight 02/28/18 1130 210 lb (95.3 kg)     Height 02/28/18 1130 5\' 5"  (1.651 m)     Head Circumference --      Peak Flow --      Pain Score 02/28/18 1129 8     Pain Loc --      Pain Edu? --      Excl. in [place]? --     Constitutional: Alert and oriented. Well appearing and in no acute distress. ENT      Head: Normocephalic and atraumatic. Cardiovascular: Normal rate, regular rhythm. Grossly normal heart sounds.  Good peripheral circulation. Respiratory: Normal respiratory effort without tachypnea nor retractions. Breath sounds are clear and equal bilaterally. No wheezes, rales, rhonchi. Musculoskeletal:  Steady gait. Bilateral pedal pulses equal and easily palpated. Neurologic:  Normal speech and language. No gross focal neurologic deficits are appreciated. Speech is normal. No gait instability.  Skin:  Skin is warm, dry.  Except: Bilateral lower ankles and dorsal feet with few scattered pruritic mildly erythematous papules, right medial ankle with erythematous less than 1 cm pustule with mild surrounding erythema and tenderness, no edema, no drainage, no lower extremity edema noted, no pain with foot range of motion and full range of motion present, normal distal sensation. Psychiatric: Mood and affect are normal. Speech and behavior are normal. Patient exhibits appropriate insight and judgment   ___________________________________________   LABS (all labs ordered are listed, but only abnormal results are displayed)  Labs Reviewed - No data to display  PROCEDURES Procedures    INITIAL IMPRESSION / ASSESSMENT AND PLAN / ED COURSE  Pertinent labs & imaging results that were available during my care of the patient were reviewed by me and considered in my medical decision making (see  chart for details).  Well-appearing patient.  No acute distress.  Insect bite appearance to bilateral lower extremities, right ankle with the area of the secondary cellulitis concern.  Tetanus immunization up-to-date per patient.  Will start patient on oral Keflex and topical Bactroban.  Discussed keeping clean, avoidance of scratching and supportive care.  Patient states that she will take her home hydroxyzine as needed for itching at night. Discussed indication, risks and benefits of medications with patient.  Discussed follow up with Primary care physician this week as needed. Discussed follow up and return parameters including no resolution or any worsening concerns. Patient verbalized understanding and agreed to plan.   ____________________________________________   FINAL  CLINICAL IMPRESSION(S) / ED DIAGNOSES  Final diagnoses:  Insect bite of right lower leg, initial encounter  Cellulitis of right lower extremity     ED Discharge Orders         Ordered    cephALEXin (KEFLEX) 500 MG capsule  3 times daily     02/28/18 1232    mupirocin ointment (BACTROBAN) 2 %     02/28/18 1232           Note: This dictation was prepared with Dragon dictation along with smaller phrase technology. Any transcriptional errors that result from this process are unintentional.         Marylene Land, NP 02/28/18 1715

## 2018-02-28 NOTE — Discharge Instructions (Addendum)
Take medication as prescribed.Cool compress. Avoid scratching.   Follow up with your primary care physician this week as needed. Return to Urgent care for new or worsening concerns.

## 2018-03-03 ENCOUNTER — Other Ambulatory Visit: Payer: Self-pay

## 2018-03-03 ENCOUNTER — Ambulatory Visit (INDEPENDENT_AMBULATORY_CARE_PROVIDER_SITE_OTHER): Payer: Medicare Other | Admitting: General Surgery

## 2018-03-03 ENCOUNTER — Encounter: Payer: Self-pay | Admitting: General Surgery

## 2018-03-03 ENCOUNTER — Ambulatory Visit: Payer: Medicare Other

## 2018-03-03 ENCOUNTER — Ambulatory Visit (INDEPENDENT_AMBULATORY_CARE_PROVIDER_SITE_OTHER): Payer: Medicare Other

## 2018-03-03 VITALS — BP 150/87 | HR 79 | Temp 97.9°F | Resp 18 | Ht 65.0 in | Wt 199.4 lb

## 2018-03-03 DIAGNOSIS — Z1231 Encounter for screening mammogram for malignant neoplasm of breast: Secondary | ICD-10-CM

## 2018-03-03 DIAGNOSIS — N644 Mastodynia: Secondary | ICD-10-CM | POA: Diagnosis not present

## 2018-03-03 NOTE — Progress Notes (Signed)
Patient ID: Brenda Lin, female   DOB: [birth year], 67 y.o.   MRN: [id number]  Chief Complaint  Patient presents with  . Follow-up     eval left breast- very painful to touch    HPI Brenda Lin is a 67 y.o. female here today for left breast pain states it hurts to touch. Patient states the pain has been on going for 2 days just in the nipple area.  HPI  Past Medical History:  Diagnosis Date  . Acid reflux   . Asthma   . Diabetes mellitus without complication ([place])   . Diverticulitis 2013  . Hypertension     Past Surgical History:  Procedure Laterality Date  . APPENDECTOMY    . COLON RESECTION  2013   Dr Marina Gravel  . COLONOSCOPY  2015   Dr Ernst Breach  . HERNIA REPAIR  2013  . SHOULDER ARTHROSCOPY W/ ROTATOR CUFF REPAIR Left 2016    Family History  Problem Relation Age of Onset  . Cancer Maternal Grandmother        breast  . Breast cancer Maternal Grandmother   . Breast cancer Sister        52's  . Breast cancer Maternal Aunt   . Breast cancer Cousin        mat cousin    Social History Social History   Tobacco Use  . Smoking status: Former Smoker    Types: Cigarettes    Last attempt to quit: 04/21/2011    Years since quitting: 6.8  . Smokeless tobacco: Never Used  Substance Use Topics  . Alcohol use: Yes    Alcohol/week: 0.0 standard drinks    Comment: rare  . Drug use: No    Allergies  Allergen Reactions  . Vicodin [Hydrocodone-Acetaminophen] Itching    Current Outpatient Medications  Medication Sig Dispense Refill  . ACCU-CHEK AVIVA PLUS test strip     . albuterol (PROVENTIL HFA;VENTOLIN HFA) 108 (90 Base) MCG/ACT inhaler Inhale 2 puffs into the lungs every 4 (four) hours as needed for wheezing or shortness of breath. 1 Inhaler 0  . allopurinol (ZYLOPRIM) 100 MG tablet Take 100 mg by mouth daily.    Marland Kitchen aspirin 325 MG EC tablet Take 325 mg by mouth daily.    Marland Kitchen atorvastatin (LIPITOR) 20 MG tablet   0  . butalbital-acetaminophen-caffeine (FIORICET,  ESGIC) 50-325-40 MG tablet Take 1 tablet by mouth every 6 (six) hours as needed for headache. 20 tablet 0  . cephALEXin (KEFLEX) 500 MG capsule Take 1 capsule (500 mg total) by mouth 3 (three) times daily for 7 days. 21 capsule 0  . colchicine 0.6 MG tablet Take 2 tablets (1.2 mg) initially. Repeat with 1 tablet( 0.6 mg) in one hour. Do not take anymore on the first day. On second day start taking 1 daily until gout attack has resolved. 15 tablet 0  . DULoxetine (CYMBALTA) 20 MG capsule duloxetine 20 mg capsule,delayed release    . fluticasone (FLONASE) 50 MCG/ACT nasal spray as needed.     Marland Kitchen losartan (COZAAR) 25 MG tablet Take 25 mg by mouth daily.    . metFORMIN (GLUCOPHAGE-XR) 500 MG 24 hr tablet Take 500 mg by mouth daily with breakfast.   0  . mupirocin ointment (BACTROBAN) 2 % Apply two times a day for 7 days. 22 g 0   No current facility-administered medications for this visit.     Review of Systems Review of Systems  Constitutional: Negative.   Respiratory: Negative.  Cardiovascular: Negative.     Blood pressure (!) 150/87, pulse 79, temperature 97.9 F (36.6 C), temperature source Temporal, resp. rate 18, height 5\' 5"  (1.651 m), weight 199 lb 6.4 oz (90.4 kg), SpO2 97 %.  Physical Exam Physical Exam  Constitutional: She is oriented to person, place, and time. She appears well-developed and well-nourished.  Eyes: Conjunctivae are normal. No scleral icterus.  Pulmonary/Chest: Right breast exhibits no inverted nipple, no mass, no nipple discharge, no skin change and no tenderness. Left breast exhibits tenderness. Left breast exhibits no inverted nipple, no mass, no nipple discharge and no skin change. Breasts are symmetrical.  Lymphadenopathy:    She has no cervical adenopathy.  Neurological: She is alert and oriented to person, place, and time.  Skin: Skin is warm and dry.    Data Reviewed Limited left breast ultrasound was completed.  In the 9 o'clock position 3 cm from the  nipple simple cyst measuring up to 0.38 cm is noted.  In the retroareolar area on the left side between the 6 and 11 o'clock position edematous tissue is noted without a distinct mass.  Mild ductal dilatation is previously noted is seen measuring up to 0.3 cm.  No significant increased vascularity is noted.  BI-RADS-3.  Assessment    Mastalgia without clear evidence of abscess.  Previously scheduled screening mammograms for later today.    Plan Patient is to return to the office after mammogram screening. Reschedule mammogram until left breast tenderness has subsided. HPI, Physical Exam, Assessment and Plan have been scribed under the direction and in the presence of Hervey Ard, Md.  Eudelia Bunch R. Bobette Mo, CMA  I have completed the exam and reviewed the above documentation for accuracy and completeness.  I agree with the above.  Haematologist has been used and any errors in dictation or transcription are unintentional.  Hervey Ard, M.D., F.A.C.S.  Forest Gleason Andreia Gandolfi 03/07/2018, 8:07 PM  Patient's screening mammogram that was scheduled for today has been cancelled at [facility].   Per the mammography department, if we reschedule mammogram while she is having the left nipple tenderness the patient will need a diagnostic mammogram.  The patient wishes to contact the mammography department once tenderness has subsided to reschedule her screening mammogram.   The patient was provided with the number to contact the [facility].   A follow up appointment was scheduled with Dr. Bary Castilla for three weeks.   The patient is aware that mammogram will need to be completed before this appointment; otherwise, follow up with Dr. Bary Castilla will need to be rescheduled. She verbalizes understanding.   Dominga Ferry, CMA

## 2018-03-03 NOTE — Patient Instructions (Addendum)
Patient is to return to the office after mammogram screening. Reschedule mammogram until left breast tenderness has subsided.   Please call the office with any questions or concerns.    Breast Tenderness Breast tenderness is a common problem for women of all ages. Breast tenderness may cause mild discomfort to severe pain. The pain usually comes and goes in association with your menstrual cycle, but it can be constant. Breast tenderness has many possible causes, including hormone changes and some medicines. Your health care provider may order tests, such as a mammogram or an ultrasound, to check for any unusual findings. Having breast tenderness usually does not mean that you have breast cancer. Follow these instructions at home: Sometimes, reassurance that you do not have breast cancer is all that is needed. In general, follow these home care instructions: Managing pain and discomfort  If directed, apply ice to the area: ? Put ice in a plastic bag. ? Place a towel between your skin and the bag. ? Leave the ice on for 20 minutes, 2-3 times a day.  Make sure you are wearing a supportive bra, especially during exercise. You may also want to wear a supportive bra while sleeping if your breasts are very tender. Medicines  Take over-the-counter and prescription medicines only as told by your health care provider. If the cause of your pain is infection, you may be prescribed an antibiotic medicine.  If you were prescribed an antibiotic, take it as told by your health care provider. Do not stop taking the antibiotic even if you start to feel better. General instructions  Your health care provider may recommend that you reduce the amount of fat in your diet. You can do this by: ? Limiting fried foods. ? Cooking foods using methods, such as baking, boiling, grilling, and broiling.  Decrease the amount of caffeine in your diet. You can do this by drinking more water and choosing caffeine-free  options.  Keep a log of the days and times when your breasts are most tender.  Ask your health care provider how to do breast exams at home. This will help you notice if you have an unusual growth or lump. Contact a health care provider if:  Any part of your breast is hard, red, and hot to the touch. This may be a sign of infection.  You are not breastfeeding and you have fluid, especially blood or pus, coming out of your nipples.  You have a fever.  You have a new or painful lump in your breast that remains after your menstrual period ends.  Your pain does not improve or it gets worse.  Your pain is interfering with your daily activities. This information is not intended to replace advice given to you by your health care provider. Make sure you discuss any questions you have with your health care provider. Document Released: 03/19/2008 Document Revised: 01/03/2016 Document Reviewed: 01/03/2016 Elsevier Interactive Patient Education  [employer].

## 2018-03-07 DIAGNOSIS — Z1231 Encounter for screening mammogram for malignant neoplasm of breast: Secondary | ICD-10-CM | POA: Insufficient documentation

## 2018-03-07 DIAGNOSIS — N644 Mastodynia: Secondary | ICD-10-CM | POA: Insufficient documentation

## 2018-03-08 ENCOUNTER — Telehealth: Payer: Self-pay | Admitting: General Surgery

## 2018-03-08 ENCOUNTER — Telehealth: Payer: Self-pay

## 2018-03-08 NOTE — Telephone Encounter (Signed)
Patient would like a call back.   Patient has hit her left breast up against the edge of bathroom door on 03/06/18. She states that there is a pimple-like area on the nipple. She states that she has added warm compresses to the area without relief and would like to discuss other sources of relief for this. Please call patient back at [phone].

## 2018-03-08 NOTE — Telephone Encounter (Signed)
Patient states she hit her left breast on the bathroom door. It is very sore and nipple feels hard. Looks No drainage. A little redness. States there is a pimple like area on the nipple with swelling. Denies fever, chills.   I asked patient if she had scheduled her mammogram and she stated no, that her breast are very sore and she is not ready to have the mammogram as long as her breast are sore. I encouraged patient to call [facility] and schedule her mammogram as she is scheduled for a follow up with Dr.Byrnett on 03/24/18.

## 2018-03-08 NOTE — Telephone Encounter (Signed)
Spoke with Dr.Byrnett and he stated to continue with cold compresses and once the soreness has resolved to schedule mammogram. Patient verbalized understanding.

## 2018-03-10 ENCOUNTER — Telehealth: Payer: Self-pay

## 2018-03-10 NOTE — Telephone Encounter (Signed)
Patient called and states that she bumped her breast again yesterday and that she had the area bust open and had a large amount of drainage. She states that the area is not sore any longer. She does have a little raw spot near the nipple. Patient instructed to keep the area clean and may cover as needed but to let get some air. She may use antibiotic ointment to nipple area until not raw any linger. She will call back if she has any further problems.

## 2018-03-24 ENCOUNTER — Other Ambulatory Visit: Payer: Self-pay

## 2018-03-24 ENCOUNTER — Ambulatory Visit (INDEPENDENT_AMBULATORY_CARE_PROVIDER_SITE_OTHER): Payer: Medicare Other | Admitting: General Surgery

## 2018-03-24 VITALS — BP 148/76 | HR 84 | Temp 97.5°F | Resp 14 | Ht 65.0 in | Wt 194.0 lb

## 2018-03-24 DIAGNOSIS — N61 Mastitis without abscess: Secondary | ICD-10-CM | POA: Diagnosis not present

## 2018-03-24 DIAGNOSIS — D213 Benign neoplasm of connective and other soft tissue of thorax: Secondary | ICD-10-CM | POA: Diagnosis not present

## 2018-03-24 DIAGNOSIS — N649 Disorder of breast, unspecified: Secondary | ICD-10-CM | POA: Diagnosis not present

## 2018-03-24 NOTE — Patient Instructions (Addendum)
The patient is aware to call back for any questions or new concerns.  May shower May remove dressing in 2-3 days  May use an Ice pack as needed for comfort

## 2018-03-24 NOTE — Progress Notes (Signed)
Patient ID: Brenda Lin, female   DOB: 02/23/1951, 67 y.o.   MRN: [id number]  Chief Complaint  Patient presents with  . Follow-up    HPI Brenda Lin is a 67 y.o. female.  Here for follow up, still having left breast pain but states it is "better". She states there was a small sore that developed where she ran into the door frame a couple of weeks ago. She states it drained with a foul odor but states it is "better now". She wanted to make sure everything was "ok" before she had her mammogram.  HPI  Past Medical History:  Diagnosis Date  . Acid reflux   . Asthma   . Diabetes mellitus without complication ([place])   . Diverticulitis 2013  . Hypertension     Past Surgical History:  Procedure Laterality Date  . APPENDECTOMY    . COLON RESECTION  2013   Dr Marina Gravel  . COLONOSCOPY  2015   Dr Ernst Breach  . HERNIA REPAIR  2013  . SHOULDER ARTHROSCOPY W/ ROTATOR CUFF REPAIR Left 2016    Family History  Problem Relation Age of Onset  . Cancer Maternal Grandmother        breast  . Breast cancer Maternal Grandmother   . Breast cancer Sister        66's  . Breast cancer Maternal Aunt   . Breast cancer Cousin        mat cousin    Social History Social History   Tobacco Use  . Smoking status: Former Smoker    Types: Cigarettes    Last attempt to quit: 04/21/2011    Years since quitting: 6.9  . Smokeless tobacco: Never Used  Substance Use Topics  . Alcohol use: Yes    Alcohol/week: 0.0 standard drinks    Comment: rare  . Drug use: No    Allergies  Allergen Reactions  . Vicodin [Hydrocodone-Acetaminophen] Itching    Current Outpatient Medications  Medication Sig Dispense Refill  . ACCU-CHEK AVIVA PLUS test strip     . albuterol (PROVENTIL HFA;VENTOLIN HFA) 108 (90 Base) MCG/ACT inhaler Inhale 2 puffs into the lungs every 4 (four) hours as needed for wheezing or shortness of breath. 1 Inhaler 0  . allopurinol (ZYLOPRIM) 100 MG tablet Take 100 mg by mouth daily.    Marland Kitchen  aspirin 325 MG EC tablet Take 325 mg by mouth daily.    Marland Kitchen atorvastatin (LIPITOR) 20 MG tablet   0  . colchicine 0.6 MG tablet Take 2 tablets (1.2 mg) initially. Repeat with 1 tablet( 0.6 mg) in one hour. Do not take anymore on the first day. On second day start taking 1 daily until gout attack has resolved. 15 tablet 0  . DULoxetine (CYMBALTA) 20 MG capsule duloxetine 20 mg capsule,delayed release    . fluticasone (FLONASE) 50 MCG/ACT nasal spray as needed.     Marland Kitchen losartan (COZAAR) 25 MG tablet Take 25 mg by mouth daily.    . metFORMIN (GLUCOPHAGE-XR) 500 MG 24 hr tablet Take 500 mg by mouth daily with breakfast.   0  . mupirocin ointment (BACTROBAN) 2 % Apply two times a day for 7 days. 22 g 0   No current facility-administered medications for this visit.     Review of Systems Review of Systems  Constitutional: Negative.   Respiratory: Negative.   Cardiovascular: Negative.     Blood pressure (!) 148/76, pulse 84, temperature (!) 97.5 F (36.4 C), temperature source Skin, resp. rate  14, height 5\' 5"  (1.651 m), weight 194 lb (88 kg), SpO2 98 %.  Physical Exam Physical Exam  Constitutional: She is oriented to person, place, and time. She appears well-developed and well-nourished.  HENT:  Mouth/Throat: Oropharynx is clear and moist.  Eyes: Conjunctivae are normal. No scleral icterus.  Neck: Neck supple.  Pulmonary/Chest: Right breast exhibits no inverted nipple, no mass, no nipple discharge, no skin change and no tenderness. Left breast exhibits no inverted nipple, no mass, no nipple discharge, no skin change and no tenderness.  Left nipple 9 o'clock 3 mm fluid cyst.    Lymphadenopathy:    She has no cervical adenopathy.    She has no axillary adenopathy.  Neurological: She is alert and oriented to person, place, and time.  Skin: Skin is warm and dry.  Psychiatric: Her behavior is normal.    Data Reviewed The patient was amenable for a biopsy.  ChloraPrep was applied followed by  10 cc of 0.5% Xylocaine with 0.25% Marcaine with 1-200,000 notes of epinephrine.  The area was recleansed with ChloraPrep and draped.  A 3 mm punch biopsy was used to remove the area of concern on the nipple.  Scant bleeding was noted.  Telfa and Tegaderm dressing applied.  Wound care reviewed.  Assessment    Nodule on the tip of the left nipple.    Plan    The patient will be contacted when biopsy results are available.     HPI, Physical Exam, Assessment and Plan have been scribed under the direction and in the presence of Brenda Bellow, MD. Brenda Fetch, RN  I have completed the exam and reviewed the above documentation for accuracy and completeness.  I agree with the above.  Haematologist has been used and any errors in dictation or transcription are unintentional.  Hervey Ard, M.D., F.A.C.S.  Brenda Lin 03/24/2018, 7:48 PM

## 2018-03-29 ENCOUNTER — Telehealth: Payer: Self-pay

## 2018-03-29 NOTE — Telephone Encounter (Signed)
-----   Message from Robert Bellow, MD sent at 03/28/2018  4:51 PM EST ----- Message left for patient that report was fine.   Arrange for a f/u appointment after she has completed her screening mammograms.   Thanks.  ----- Message ----- From: Interface, [facility] Sent: 03/28/2018   4:08 PM EST To: Robert Bellow, MD

## 2018-04-06 NOTE — Telephone Encounter (Signed)
Spoke with patient and she said that she will reschedule her mammogram and then call us for a follow up afterwards with Dr Bary Castilla.

## 2018-04-26 ENCOUNTER — Other Ambulatory Visit: Payer: Self-pay

## 2018-04-26 ENCOUNTER — Ambulatory Visit
Admission: EM | Admit: 2018-04-26 | Discharge: 2018-04-26 | Disposition: A | Discharge status: Home / Self Care | Payer: Medicare Other | Attending: Family Medicine | Admitting: Family Medicine

## 2018-04-26 DIAGNOSIS — S8002XA Contusion of left knee, initial encounter: Secondary | ICD-10-CM

## 2018-04-26 DIAGNOSIS — S39012A Strain of muscle, fascia and tendon of lower back, initial encounter: Secondary | ICD-10-CM | POA: Diagnosis not present

## 2018-04-26 DIAGNOSIS — S161XXA Strain of muscle, fascia and tendon at neck level, initial encounter: Secondary | ICD-10-CM

## 2018-04-26 MED ORDER — TIZANIDINE HCL 4 MG PO CAPS: 4.0000 mg | ORAL_CAPSULE | Freq: Three times a day (TID) | ORAL | 0 refills | Status: DC

## 2018-04-26 MED ORDER — MELOXICAM 7.5 MG PO TABS: 7.5000 mg | ORAL_TABLET | Freq: Every day | ORAL | 0 refills | Status: DC

## 2018-04-26 NOTE — Discharge Instructions (Signed)
Apply ice 20 minutes out of every 2 hours 4-5 times daily for comfort.  Follow-up with your primary care physician in 1 week
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
## 2018-04-29 ENCOUNTER — Other Ambulatory Visit: Payer: Self-pay | Admitting: Physician Assistant

## 2018-04-29 DIAGNOSIS — Z1231 Encounter for screening mammogram for malignant neoplasm of breast: Secondary | ICD-10-CM

## 2018-05-05 ENCOUNTER — Other Ambulatory Visit: Payer: Self-pay

## 2018-05-05 ENCOUNTER — Ambulatory Visit
Admission: EM | Admit: 2018-05-05 | Discharge: 2018-05-05 | Disposition: A | Discharge status: Home / Self Care | Payer: Medicare Other | Attending: Family Medicine | Admitting: Family Medicine

## 2018-05-05 ENCOUNTER — Encounter: Payer: Self-pay | Admitting: Emergency Medicine

## 2018-05-05 DIAGNOSIS — M5432 Sciatica, left side: Secondary | ICD-10-CM | POA: Diagnosis not present

## 2018-05-05 DIAGNOSIS — S161XXD Strain of muscle, fascia and tendon at neck level, subsequent encounter: Secondary | ICD-10-CM | POA: Diagnosis not present

## 2018-05-05 DIAGNOSIS — I10 Essential (primary) hypertension: Secondary | ICD-10-CM

## 2018-05-05 DIAGNOSIS — S39012D Strain of muscle, fascia and tendon of lower back, subsequent encounter: Secondary | ICD-10-CM

## 2018-05-05 MED ORDER — TIZANIDINE HCL 4 MG PO CAPS: 4.0000 mg | ORAL_CAPSULE | Freq: Three times a day (TID) | ORAL | 0 refills | Status: DC
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
## 2018-05-11 ENCOUNTER — Ambulatory Visit
Admission: RE | Admit: 2018-05-11 | Discharge: 2018-05-11 | Disposition: A | Discharge status: Home / Self Care | Payer: Medicare Other | Source: Ambulatory Visit | Attending: Physician Assistant | Admitting: Physician Assistant

## 2018-05-11 ENCOUNTER — Encounter (INDEPENDENT_AMBULATORY_CARE_PROVIDER_SITE_OTHER): Payer: Self-pay

## 2018-05-11 DIAGNOSIS — Z1231 Encounter for screening mammogram for malignant neoplasm of breast: Secondary | ICD-10-CM | POA: Diagnosis present

## 2018-06-02 ENCOUNTER — Other Ambulatory Visit: Payer: Self-pay

## 2018-06-02 ENCOUNTER — Encounter: Payer: Self-pay | Admitting: General Surgery

## 2018-06-02 ENCOUNTER — Ambulatory Visit (INDEPENDENT_AMBULATORY_CARE_PROVIDER_SITE_OTHER): Payer: Medicare Other | Admitting: General Surgery

## 2018-06-02 VITALS — BP 172/99 | HR 78 | Temp 97.5°F | Resp 16 | Ht 65.0 in | Wt 194.6 lb

## 2018-06-02 DIAGNOSIS — N6452 Nipple discharge: Secondary | ICD-10-CM

## 2018-06-02 MED ORDER — LEVOFLOXACIN 500 MG PO TABS: 500.0000 mg | ORAL_TABLET | Freq: Every day | ORAL | 0 refills | Status: AC

## 2018-06-02 NOTE — Patient Instructions (Addendum)
Patient is to return to the office in 3 weeks with Dr.Byrnett. Please go to your pharmacy and pick up your medication complete the full course if you have any issues with the medication call the office.  Call the office with any questions or concerns. Probiotics Probiotics are the good bacteria and yeasts that live in your body and keep your digestive system healthy. Probiotics also help your body's defense system (immune system) and protect your body against the growth of harmful bacteria. Your health care provider may recommend taking a probiotic if you are taking antibiotics or have certain medical conditions, such as:  Diarrhea.  Constipation.  Irritable bowel syndrome.  Lung infections.  Yeast infections.  Acne, eczema, and other skin conditions.  Frequent urinary tract infections. What affects the balance of bacteria in my body? The balance of good bacteria in your body can be affected by:  Antibiotic medicines. These medicines treat infections caused by bacteria. Unfortunately, they may kill the good bacteria in your body as well as the bad bacteria.  Certain medical conditions. Conditions related to an imbalance of bacteria include: ? Stomach and intestine (gastrointestinal) infections. ? Lung infections. ? Skin infections. ? Vaginal infections. ? Inflammatory bowel diseases. ? Stomach ulcers (gastric ulcers). ? Tooth decay and gum disease (periodontal disease).  Stress.  Poor diet. What type of probiotic is right for me? Probiotics contain different types of bacteria (strains). Strains commonly found in probiotics include:  Lactobacillus.  Saccharomyces.  Bifidobacterium. Specific strains have been shown to be more effective for certain health conditions. Ask your health care provider which strain or strains you should use and how often. Probiotics come in many different forms, strain combinations, and strengths. Some may need to be refrigerated. Always read the  label for storage and usage instructions. Certain foods, such as yogurt, contain probiotics. Probiotics can also be bought as a supplement at a pharmacy, health food store, or grocery store. Talk to your health care provider before starting any supplement. What are the side effects of probiotics? Some people have side effects when taking probiotics. Side effects are usually temporary and may include:  Gas.  Bloating.  Cramping. Serious side effects are rare. Follow these instructions at home:   If you are taking probiotics with antibiotics: ? Wait at least 2 hours between taking your medicine and the probiotic. ? Eat foods high in fiber, such as whole grains, beans, and vegetables. These foods can help good bacteria grow. ? Avoid certain foods as told by your health care provider. Summary  Probiotics are the good bacteria and yeasts that live in your body and keep you and your digestive system healthy.  Certain foods, such as yogurt, contain probiotics.  Probiotics can be taken as supplements. They can be bought at a pharmacy, health food store, or grocery store. They come in many different forms, strain combinations, and strengths.  Be sure to talk with your health care provider before taking a probiotic supplement. This information is not intended to replace advice given to you by your health care provider. Make sure you discuss any questions you have with your health care provider. Document Released: 11/01/2013 Document Revised: 12/24/2017 Document Reviewed: 04/21/2017 Elsevier Interactive Patient Education  2019 [employer].

## 2018-06-02 NOTE — Progress Notes (Signed)
Patient ID: Brenda Lin, female   DOB: 1950-11-06, 68 y.o.   MRN: [id number]  Chief Complaint  Patient presents with  . Follow-up    f/u left breast cyst on nipple, discharge with odor    HPI Brenda Lin is a 68 y.o. female here today for left breast cyst states it has a mild odor, has a slight itch she noticed it on 05/31/18. Patient states she is feeling well considering.  HPI  Past Medical History:  Diagnosis Date  . Acid reflux   . Asthma   . Diabetes mellitus without complication ([place])   . Diverticulitis 2013  . Hypertension     Past Surgical History:  Procedure Laterality Date  . APPENDECTOMY    . BREAST BIOPSY Left 02/2018   u/s bx by Dr Bary Castilla- neg  . COLON RESECTION  2013   Dr Marina Gravel  . COLONOSCOPY  2015   Dr Ernst Breach  . HERNIA REPAIR  2013  . SHOULDER ARTHROSCOPY W/ ROTATOR CUFF REPAIR Left 2016    Family History  Problem Relation Age of Onset  . Cancer Maternal Grandmother        breast  . Breast cancer Maternal Grandmother   . Breast cancer Sister        22's  . Breast cancer Maternal Aunt   . Breast cancer Cousin        mat cousin    Social History Social History   Tobacco Use  . Smoking status: Former Smoker    Types: Cigarettes    Last attempt to quit: 04/21/2011    Years since quitting: 7.1  . Smokeless tobacco: Never Used  Substance Use Topics  . Alcohol use: Yes    Alcohol/week: 0.0 standard drinks    Comment: rare  . Drug use: No    Allergies  Allergen Reactions  . Vicodin [Hydrocodone-Acetaminophen] Itching    Current Outpatient Medications  Medication Sig Dispense Refill  . ACCU-CHEK AVIVA PLUS test strip     . albuterol (PROVENTIL HFA;VENTOLIN HFA) 108 (90 Base) MCG/ACT inhaler Inhale 2 puffs into the lungs every 4 (four) hours as needed for wheezing or shortness of breath. 1 Inhaler 0  . allopurinol (ZYLOPRIM) 100 MG tablet Take 100 mg by mouth daily.    Marland Kitchen aspirin 325 MG EC tablet Take 325 mg by mouth daily.    Marland Kitchen  atorvastatin (LIPITOR) 20 MG tablet   0  . colchicine 0.6 MG tablet Take 2 tablets (1.2 mg) initially. Repeat with 1 tablet( 0.6 mg) in one hour. Do not take anymore on the first day. On second day start taking 1 daily until gout attack has resolved. 15 tablet 0  . DULoxetine (CYMBALTA) 20 MG capsule duloxetine 20 mg capsule,delayed release    . fluticasone (FLONASE) 50 MCG/ACT nasal spray as needed.     Marland Kitchen losartan (COZAAR) 25 MG tablet Take 25 mg by mouth daily.    . meloxicam (MOBIC) 7.5 MG tablet Take 1 tablet (7.5 mg total) by mouth daily. 30 tablet 0  . metFORMIN (GLUCOPHAGE-XR) 500 MG 24 hr tablet Take 500 mg by mouth daily with breakfast.   0  . mupirocin ointment (BACTROBAN) 2 % Apply two times a day for 7 days. 22 g 0  . tiZANidine (ZANAFLEX) 4 MG capsule Take 1 capsule (4 mg total) by mouth 3 (three) times daily. 21 capsule 0  . levofloxacin (LEVAQUIN) 500 MG tablet Take 1 tablet (500 mg total) by mouth daily for 10 days.  10 tablet 0   No current facility-administered medications for this visit.     Review of Systems Review of Systems  Constitutional: Negative.   Respiratory: Negative.   Cardiovascular: Negative.     Blood pressure (!) 172/99, pulse 78, temperature (!) 97.5 F (36.4 C), temperature source Temporal, resp. rate 16, height 5\' 5"  (1.651 m), weight 194 lb 9.6 oz (88.3 kg), SpO2 97 %.  Physical Exam Physical Exam Constitutional:      Appearance: She is well-developed.  Eyes:     General: No scleral icterus.    Conjunctiva/sclera: Conjunctivae normal.  Neck:     Musculoskeletal: Normal range of motion.  Cardiovascular:     Rate and Rhythm: Normal rate and regular rhythm.     Heart sounds: Normal heart sounds.  Pulmonary:     Effort: Pulmonary effort is normal.     Breath sounds: Normal breath sounds.  Chest:     Breasts: Breasts are symmetrical.        Right: No inverted nipple, mass, nipple discharge, skin change or tenderness.        Left: No inverted  nipple, mass, nipple discharge, skin change or tenderness.    Lymphadenopathy:     Cervical: No cervical adenopathy.     Upper Body:     Right upper body: No supraclavicular or axillary adenopathy.     Left upper body: No supraclavicular or axillary adenopathy.  Skin:    General: Skin is warm and dry.  Neurological:     Mental Status: She is alert and oriented to person, place, and time.     Data Reviewed October 02, 2014 left nipple wound culture: Result 1 CommentAbnormal    Comment: Acinetobacter calcoaceticus baumannii complex  Heavy growth   Result 2 CommentAbnormal    Comment: Pseudomonas aeruginosa  Moderate growth   Levofloxacin          S     S    Assessment    Recurrent ductal inflammation with purulent drainage.    Plan  We will make use of a 10-day trial of Levaquin 500 mg/day.  Warm compresses for comfort.  Reassessment in 3 weeks.  The patient is encouraged to make use of yogurt or probiotics daily while on antibiotics.  The possible need to re-excise this area to resolve the chronic infection was discussed. HPI, Physical Exam, Assessment and Plan have been scribed under the direction and in the presence of Hervey Ard, Md.  Eudelia Bunch R. Bobette Mo, CMA  I have completed the exam and reviewed the above documentation for accuracy and completeness.  I agree with the above.  Haematologist has been used and any errors in dictation or transcription are unintentional.  Hervey Ard, M.D., F.A.C.S.  Forest Gleason Gibril Mastro 06/03/2018, 5:51 AM

## 2018-06-23 ENCOUNTER — Other Ambulatory Visit: Payer: Self-pay

## 2018-06-23 ENCOUNTER — Other Ambulatory Visit: Payer: Self-pay | Admitting: General Surgery

## 2018-06-23 ENCOUNTER — Ambulatory Visit (INDEPENDENT_AMBULATORY_CARE_PROVIDER_SITE_OTHER): Payer: Medicare Other | Admitting: General Surgery

## 2018-06-23 ENCOUNTER — Encounter: Payer: Self-pay | Admitting: General Surgery

## 2018-06-23 VITALS — BP 173/104 | HR 79 | Temp 97.5°F | Resp 18 | Ht 65.0 in | Wt 194.8 lb

## 2018-06-23 DIAGNOSIS — N6452 Nipple discharge: Secondary | ICD-10-CM | POA: Diagnosis not present

## 2018-06-23 NOTE — Patient Instructions (Addendum)
The patient is aware to call back for any questions or new concerns.  Schedule left breast surgery 

## 2018-06-23 NOTE — Progress Notes (Signed)
Patient ID: Makenzye Troutman, female   DOB: Mar 18, 1951, 68 y.o.   MRN: [id number]  Chief Complaint  Patient presents with  . Follow-up     3 week f/u breast infection     HPI Ashleen Demma is a 68 y.o. female here today for 3 week follow up for breast  Infection, patient states she has a slight improvement.  She is still experiencing drainage and tenderness with palpation. HPI  Past Medical History:  Diagnosis Date  . Acid reflux   . Asthma   . Diabetes mellitus without complication ([place])   . Diverticulitis 2013  . Hypertension     Past Surgical History:  Procedure Laterality Date  . APPENDECTOMY    . BREAST BIOPSY Left 02/2018   u/s bx by Dr Bary Castilla- neg  . COLON RESECTION  2013   Dr Marina Gravel  . COLONOSCOPY  2015   Dr Ernst Breach  . HERNIA REPAIR  2013  . SHOULDER ARTHROSCOPY W/ ROTATOR CUFF REPAIR Left 2016    Family History  Problem Relation Age of Onset  . Cancer Maternal Grandmother        breast  . Breast cancer Maternal Grandmother   . Breast cancer Sister        46's  . Breast cancer Maternal Aunt   . Breast cancer Cousin        mat cousin    Social History Social History   Tobacco Use  . Smoking status: Former Smoker    Types: Cigarettes    Last attempt to quit: 04/21/2011    Years since quitting: 7.1  . Smokeless tobacco: Never Used  Substance Use Topics  . Alcohol use: Yes    Alcohol/week: 0.0 standard drinks    Comment: rare  . Drug use: No    Allergies  Allergen Reactions  . Vicodin [Hydrocodone-Acetaminophen] Itching    Current Outpatient Medications  Medication Sig Dispense Refill  . ACCU-CHEK AVIVA PLUS test strip     . albuterol (PROVENTIL HFA;VENTOLIN HFA) 108 (90 Base) MCG/ACT inhaler Inhale 2 puffs into the lungs every 4 (four) hours as needed for wheezing or shortness of breath. 1 Inhaler 0  . allopurinol (ZYLOPRIM) 100 MG tablet Take 100 mg by mouth daily.    Marland Kitchen amLODipine (NORVASC) 10 MG tablet     . aspirin EC 81 MG tablet Take  81 mg by mouth daily.    Marland Kitchen atorvastatin (LIPITOR) 20 MG tablet   0  . buPROPion (WELLBUTRIN XL) 150 MG 24 hr tablet     . colchicine 0.6 MG tablet Take 2 tablets (1.2 mg) initially. Repeat with 1 tablet( 0.6 mg) in one hour. Do not take anymore on the first day. On second day start taking 1 daily until gout attack has resolved. 15 tablet 0  . DULoxetine (CYMBALTA) 20 MG capsule duloxetine 20 mg capsule,delayed release    . fluticasone (FLONASE) 50 MCG/ACT nasal spray as needed.     Marland Kitchen losartan (COZAAR) 25 MG tablet Take 25 mg by mouth daily.    . metFORMIN (GLUCOPHAGE-XR) 500 MG 24 hr tablet Take 500 mg by mouth daily with breakfast.   0  . mupirocin ointment (BACTROBAN) 2 % Apply two times a day for 7 days. 22 g 0  . tiZANidine (ZANAFLEX) 4 MG capsule Take 1 capsule (4 mg total) by mouth 3 (three) times daily. 21 capsule 0  . meloxicam (MOBIC) 15 MG tablet Take 1 tablet (15 mg total) by mouth daily. 30 tablet  0   No current facility-administered medications for this visit.     Review of Systems Review of Systems  Constitutional: Negative.   Respiratory: Negative.   Cardiovascular: Negative.     Blood pressure (!) 173/104, pulse 79, temperature (!) 97.5 F (36.4 C), temperature source Temporal, resp. rate 18, height 5\' 5"  (1.651 m), weight 194 lb 12.8 oz (88.4 kg), SpO2 96 %.  Physical Exam Physical Exam Exam conducted with a chaperone present.  Constitutional:      Appearance: She is well-developed.  HENT:     Mouth/Throat:     Pharynx: No oropharyngeal exudate.  Eyes:     General: No scleral icterus.    Conjunctiva/sclera: Conjunctivae normal.  Neck:     Musculoskeletal: Normal range of motion and neck supple.  Cardiovascular:     Rate and Rhythm: Normal rate and regular rhythm.     Heart sounds: Normal heart sounds.  Pulmonary:     Effort: Pulmonary effort is normal.     Breath sounds: Normal breath sounds.  Chest:     Breasts: Breasts are symmetrical.        Left: No  inverted nipple, mass, nipple discharge, skin change or tenderness.    Lymphadenopathy:     Cervical: No cervical adenopathy.  Skin:    General: Skin is warm and dry.  Neurological:     Mental Status: She is alert and oriented to person, place, and time.  Psychiatric:        Behavior: Behavior normal.          Assessment Persistent breast infection.  Plan Options for management include continued observation without additional antibiotics or surgical intervention.  The patient obtained good relief the last time this area was cleaned out and desires to proceed with exploration.  Schedule left breast surgery    HPI, assessment, plan and physical exam has been scribed under the direction and in the presence of Robert Bellow, MD. [name], RN  HPI, Physical Exam, Assessment and Plan have been scribed under the direction and in the presence of Hervey Ard, Md.  Eudelia Bunch R. Bobette Mo, CMA Forest Gleason Shereka Lafortune 06/24/2018, 3:18 PM  I have completed the exam and reviewed the above documentation for accuracy and completeness.  I agree with the above.  Haematologist has been used and any errors in dictation or transcription are unintentional.  Hervey Ard, M.D., F.A.C.S.  Patient's surgery to be scheduled for 06-27-18 at [facility] with Dr. Bary Castilla. It is okay for patient to continue an 81 mg aspirin once daily.   The patient is aware she will be contacted by the [facility] to complete a phone interview sometime in the near future.  Dominga Ferry, CMA

## 2018-06-24 ENCOUNTER — Ambulatory Visit
Admission: RE | Admit: 2018-06-24 | Discharge: 2018-06-24 | Disposition: A | Discharge status: Home / Self Care | Payer: Medicare Other | Source: Ambulatory Visit | Attending: General Surgery | Admitting: General Surgery

## 2018-06-24 ENCOUNTER — Other Ambulatory Visit: Payer: Self-pay

## 2018-06-24 ENCOUNTER — Encounter: Payer: Self-pay | Admitting: Gynecology

## 2018-06-24 ENCOUNTER — Ambulatory Visit
Admission: EM | Admit: 2018-06-24 | Discharge: 2018-06-24 | Disposition: A | Discharge status: Home / Self Care | Payer: Medicare Other | Attending: Family Medicine | Admitting: Family Medicine

## 2018-06-24 ENCOUNTER — Ambulatory Visit
Admission: RE | Admit: 2018-06-24 | Discharge: 2018-06-24 | Disposition: A | Discharge status: Home / Self Care | Payer: Medicare Other | Source: Ambulatory Visit | Attending: Family Medicine | Admitting: Family Medicine

## 2018-06-24 DIAGNOSIS — I10 Essential (primary) hypertension: Secondary | ICD-10-CM | POA: Diagnosis not present

## 2018-06-24 DIAGNOSIS — M79662 Pain in left lower leg: Secondary | ICD-10-CM

## 2018-06-24 DIAGNOSIS — M79605 Pain in left leg: Secondary | ICD-10-CM | POA: Diagnosis not present

## 2018-06-24 HISTORY — DX: Depression, unspecified: F32.A

## 2018-06-24 HISTORY — DX: Major depressive disorder, single episode, unspecified: F32.9

## 2018-06-24 MED ORDER — MELOXICAM 15 MG PO TABS: 15.0000 mg | ORAL_TABLET | Freq: Every day | ORAL | 0 refills | Status: DC

## 2018-06-24 NOTE — Discharge Instructions (Signed)
Tylenol/advil as needed Heat/ice to area

## 2018-06-24 NOTE — Pre-Procedure Instructions (Signed)
Patient states that she is coming in this afternoon for an ultrasound of her leg.  She has been having unusual discomfort and they are looking for a blood clot.

## 2018-06-24 NOTE — ED Triage Notes (Signed)
Ultrasound scheduled for 3:45 today at [facility] outpatient.

## 2018-06-24 NOTE — ED Triage Notes (Signed)
Per patient c/o left leg pain x 4-5 days ago. Patient stated hurts when put pressure on. Patient deny any injury to her left

## 2018-06-24 NOTE — Patient Instructions (Signed)
INSTRUCTIONS FOR SURGERY     Your surgery is scheduled for:  Monday, MARCH 9TH      PLEASE arrive in Same Day Surgery at 7:00 am     When you arrive for surgery, report to the [facility].       Do NOT stop on the first floor to register.    REMEMBER: Instructions that are not followed completely may result in serious medical risk,  up to and including death, or upon the discretion of your surgeon and anesthesiologist,            your surgery may need to be rescheduled.  __X__ 1. Do not eat food after midnight the night before your procedure.                    No gum, candy, lozenger, tic tacs, tums or hard candies.                  ABSOLUTELY NOTHING SOLID IN YOUR MOUTH AFTER MIDNIGHT                    You may drink unlimited clear liquids up to 2 hours before you are scheduled to arrive for surgery.                   Do not drink anything within those 2 hours unless you need to take medicine, then take the                   smallest amount you need.  Clear liquids include:  water, apple juice without pulp,                   any flavor Gatorade, Black coffee, black tea.  Sugar may be added but no dairy/ honey /lemon.                        Broth and jello is not considered a clear liquid.  __x__  2. On the morning of surgery, please brush your teeth with toothpaste and water. You may rinse with                  mouthwash if you wish but DO NOT SWALLOW TOOTHPASTE OR MOUTHWASH  __X___3. NO alcohol for 24 hours before or after surgery.  __x___ 4.  Do NOT smoke or use e-cigarettes for 24 HOURS PRIOR TO SURGERY.                      DO NOT Use any chewable tobacco products for at least 6 hours prior to surgery.  __x___ 5. If you start any new medication after this appointment and prior to surgery, please                   Bring it with you on the day of surgery.  ___x__ 6. Notify your doctor if there is any  change in your medical condition, such as fever, infection, vomitting, diarrhea.  __x___ 7.  USE antibacterial soap as instructed, the night before surgery and the day of  surgery.                   Once you have washed with this soap, do NOT use any of the following: Powders, perfumes or lotions.                   Please do not wear make up, hairpins, clips or nail polish. You may not wear deodorant.                   Men may shave their face and neck.  Women need to shave 48 hours prior to surgery.                   DO NOT wear ANY jewelry on the day of surgery. If there are rings that are too tight to remove easily,                     please address this prior to the surgery day. Piercings need to be removed.                                                                     NO METAL ON YOUR BODY.                    Do NOT bring any valuables.                      If you came to Pre-Admit testing then you will not need license, insurance card or credit card.                      If you will be staying overnight, please either leave your things in the car or have your family be                     responsible for these items.                     [facility] IS NOT RESPONSIBLE FOR BELONGINGS OR VALUABLES.  ___X__ 8. DO NOT wear contact lenses on surgery day.  You may not have dentures,                     Hearing aides, contacts or glasses in the operating room. These items can be                    Placed in the Recovery Room to receive immediately after surgery.  __x___ 9. IF YOU ARE SCHEDULED TO GO HOME ON THE SAME DAY, YOU MUST                   Have someone to drive you home and to stay with you  for the first 24 hours.                    Have an arrangement prior to arriving on surgery day.  ___x__ 10. Take the following medications on the morning of surgery with a sip of water:  1.albuterol inhaler                     2.allopurinol                      3.flonase                     4.aspirin                     5.atorvastatin                     6.  _____ 11.  Follow any instructions provided to you by your surgeon.                        Such as enema, clear liquid bowel prep  __   _  12. STOP COUMADIN / PLAVIX / ELIQUIS / ASPIRIN AS OF: N/A   CONTINUE ASPIRIN                       THIS INCLUDES BC POWDERS / GOODIES POWDER  __x___ 13. STOP Anti-inflammatories as of: TODAY                      This includes IBUPROFEN / MOTRIN / ADVIL / ALEVE/ NAPROXYN / MELOXICAM                    YOU MAY TAKE TYLENOL ANY TIME PRIOR TO SURGERY.  _____ 14.  Stop supplements until after surgery.                     This includes: N/A                  You may continue taking Vitamin B12 / Vitamin D3 but do not take on the morning of surgery.  _____ 15. Bring your CPAP machine into preop with you on the morning of surgery.  __X____16.  Stop Metformin 2 full days prior to surgery.  Stop [id number]:[id number], MARCH 5TH                     TAKE 1/2 OF USUAL INSULIN DOSE ON THE EVENING PRIOR TO SURGERY.                     Do NOT take any diabetes medications on surgery day.  __X____17.  Continue to take the following medications but do not take on the morning of surgery:                                LOSARTAN   ______18. If staying overnight, please have appropriate shoes to wear to be able to walk around the unit.                   Wear clean and comfortable clothing to the hospital.

## 2018-06-24 NOTE — ED Provider Notes (Signed)
[facility]    CSN: [id number] Arrival date & time: 06/24/18  1253     History   Chief Complaint No chief complaint on file.   HPI Brenda Lin is a 68 y.o. female.   68 yo female with a c/o left leg pain for the past 4-5 days. Denies any falls, injuries, rash, fevers, chills, chest pains, shortness of breath.  States she was doing a lot of driving last week for 1.5 hours at a time with walking breaks.   The history is provided by the patient.    Past Medical History:  Diagnosis Date  . Acid reflux   . Asthma   . Depression   . Diabetes mellitus without complication ([place])   . Diverticulitis 2013  . Hypertension     Patient Active Problem List   Diagnosis Date Noted  . Lesion of nipple 03/24/2018  . Pain of left breast 03/07/2018  . Encounter for screening mammogram for breast cancer 03/07/2018  . Nipple discharge 10/03/2014    Past Surgical History:  Procedure Laterality Date  . APPENDECTOMY    . BREAST BIOPSY Left 02/2018   u/s bx by Dr Bary Castilla- neg  . COLON RESECTION  2013   Dr Marina Gravel  . COLONOSCOPY  2015   Dr Ernst Breach  . HERNIA REPAIR  [id number]   umbilical  . SHOULDER ARTHROSCOPY W/ ROTATOR CUFF REPAIR Left 2016    OB History    Gravida  2   Para  2   Term      Preterm      AB      Living  2     SAB      TAB      Ectopic      Multiple      Live Births           Obstetric Comments  1st Menstrual Cycle:  13 1st Pregnancy:  20          Home Medications    Prior to Admission medications   Medication Sig Start Date End Date Taking? Authorizing Provider  ACCU-CHEK AVIVA PLUS test strip  07/11/14  Yes [provider]  albuterol (PROVENTIL HFA;VENTOLIN HFA) 108 (90 Base) MCG/ACT inhaler Inhale 2 puffs into the lungs every 4 (four) hours as needed for wheezing or shortness of breath. 11/28/15  Yes Defelice, Jeanett Schlein, NP  allopurinol (ZYLOPRIM) 100 MG tablet Take 100 mg by mouth daily.   Yes [provider]  amLODipine (NORVASC) 10 MG tablet  06/22/18  Yes [provider]  aspirin EC 81 MG tablet Take 81 mg by mouth daily.   Yes [provider]  atorvastatin (LIPITOR) 20 MG tablet  02/21/18  Yes [provider]  buPROPion (WELLBUTRIN XL) 150 MG 24 hr tablet as needed.  06/22/18  Yes [provider]  colchicine 0.6 MG tablet Take 2 tablets (1.2 mg) initially. Repeat with 1 tablet( 0.6 mg) in one hour. Do not take anymore on the first day. On second day start taking 1 daily until gout attack has resolved. 09/08/16  Yes Lorin Picket, PA-C  DULoxetine (CYMBALTA) 20 MG capsule duloxetine 20 mg capsule,delayed release   Yes [provider]  fluticasone (FLONASE) 50 MCG/ACT nasal spray as needed.  10/01/14  Yes [provider]  losartan (COZAAR) 50 MG tablet Take 50 mg by mouth daily.    Yes [provider]  metFORMIN (GLUCOPHAGE-XR) 500 MG 24 hr tablet Take 500 mg  by mouth daily with breakfast.  09/13/14  Yes [provider]  mupirocin ointment (BACTROBAN) 2 % Apply two times a day for 7 days. 02/28/18  Yes Marylene Land, NP  tiZANidine (ZANAFLEX) 4 MG capsule Take 1 capsule (4 mg total) by mouth 3 (three) times daily. 05/05/18  Yes Lorin Picket, PA-C  meloxicam (MOBIC) 15 MG tablet Take 1 tablet (15 mg total) by mouth daily. 06/24/18   Norval Gable, MD    Family History Family History  Problem Relation Age of Onset  . Cancer Maternal Grandmother        breast  . Breast cancer Maternal Grandmother   . Breast cancer Sister        54's  . Breast cancer Maternal Aunt   . Breast cancer Cousin        mat cousin    Social History Social History   Tobacco Use  . Smoking status: Former Smoker    Packs/day: 1.00    Types: Cigarettes    Last attempt to quit: 04/21/2011    Years since quitting: 7.1  . Smokeless tobacco: Never Used  Substance Use Topics  . Alcohol use: Yes    Alcohol/week: 0.0 standard drinks    Comment:  rare beer  . Drug use: No     Allergies   Vicodin [hydrocodone-acetaminophen]   Review of Systems Review of Systems   Physical Exam Triage Vital Signs ED Triage Vitals  Enc Vitals Group     BP 06/24/18 1313 (!) 179/96     Pulse Rate 06/24/18 1313 81     Resp 06/24/18 1313 16     Temp 06/24/18 1313 98.7 F (37.1 C)     Temp Source 06/24/18 1313 Oral     SpO2 06/24/18 1313 98 %     Weight 06/24/18 1314 194 lb 12.8 oz (88.4 kg)     Height --      Head Circumference --      Peak Flow --      Pain Score 06/24/18 1314 10     Pain Loc --      Pain Edu? --      Excl. in [place]? --    No data found.  Updated Vital Signs BP (!) 179/96 (BP Location: Left Arm)   Pulse 81   Temp 98.7 F (37.1 C) (Oral)   Resp 16   Wt 88.4 kg   SpO2 98%   BMI 32.42 kg/m   Visual Acuity Right Eye Distance:   Left Eye Distance:   Bilateral Distance:    Right Eye Near:   Left Eye Near:    Bilateral Near:     Physical Exam Vitals signs and nursing note reviewed.  Constitutional:      General: She is not in acute distress.    Appearance: She is not toxic-appearing or diaphoretic.  Musculoskeletal:     Left lower leg: She exhibits tenderness (over the mid calf area). She exhibits no bony tenderness, no swelling, no deformity and no laceration. No edema.  Neurological:     Mental Status: She is alert.      UC Treatments / Results  Labs (all labs ordered are listed, but only abnormal results are displayed) Labs Reviewed - No data to display  EKG None  Radiology US Venous Img Lower Unilateral Left  Result Date: 06/24/2018 CLINICAL DATA:  Acute left calf pain EXAM: LEFT LOWER EXTREMITY VENOUS DOPPLER ULTRASOUND TECHNIQUE: Gray-scale sonography with graded compression, as well as  color Doppler and duplex ultrasound were performed to evaluate the lower extremity deep venous systems from the level of the common femoral vein and including the common femoral, femoral, profunda femoral,  popliteal and calf veins including the posterior tibial, peroneal and gastrocnemius veins when visible. The superficial great saphenous vein was also interrogated. Spectral Doppler was utilized to evaluate flow at rest and with distal augmentation maneuvers in the common femoral, femoral and popliteal veins. COMPARISON:  None. FINDINGS: Contralateral Common Femoral Vein: Respiratory phasicity is normal and symmetric with the symptomatic side. No evidence of thrombus. Normal compressibility. Common Femoral Vein: No evidence of thrombus. Normal compressibility, respiratory phasicity and response to augmentation. Saphenofemoral Junction: No evidence of thrombus. Normal compressibility and flow on color Doppler imaging. Profunda Femoral Vein: No evidence of thrombus. Normal compressibility and flow on color Doppler imaging. Femoral Vein: No evidence of thrombus. Normal compressibility, respiratory phasicity and response to augmentation. Popliteal Vein: No evidence of thrombus. Normal compressibility, respiratory phasicity and response to augmentation. Calf Veins: No evidence of thrombus. Normal compressibility and flow on color Doppler imaging. Superficial Great Saphenous Vein: No evidence of thrombus. Normal compressibility. Venous Reflux:  None. Other Findings:  None. IMPRESSION: No evidence of deep venous thrombosis. Electronically Signed   By: Jerilynn Mages.  Shick M.D.   On: 06/24/2018 17:06    Procedures Procedures (including critical care time)  Medications Ordered in UC Medications - No data to display  Initial Impression / Assessment and Plan / UC Course  I have reviewed the triage vital signs and the nursing notes.  Pertinent labs & imaging results that were available during my care of the patient were reviewed by me and considered in my medical decision making (see chart for details).      Final Clinical Impressions(s) / UC Diagnoses   Final diagnoses:  Pain of left calf  Pain in left leg      Discharge Instructions     Tylenol/advil as needed Heat/ice to area    ED Prescriptions    Medication Sig Dispense Auth. Provider   meloxicam (MOBIC) 15 MG tablet Take 1 tablet (15 mg total) by mouth daily. 30 tablet Norval Gable, MD     1. diagnosis reviewed with patient 2. rx as per orders above; reviewed possible side effects, interactions, risks and benefits  3. Recommend lower extremity US today to rule out DVT (note: [place] negative and patient notified) 4. Recommend supportive treatment as above 5. Follow-up prn if symptoms worsen or don't improve   Controlled Substance Prescriptions [place] Controlled Substance Registry consulted? Not Applicable   Norval Gable, MD 06/24/18 [phone]

## 2018-06-27 ENCOUNTER — Encounter
Admission: RE | Disposition: A | Discharge status: Home / Self Care | Payer: Self-pay | Source: Home / Self Care | Attending: General Surgery

## 2018-06-27 ENCOUNTER — Ambulatory Visit
Admission: RE | Admit: 2018-06-27 | Discharge: 2018-06-27 | Disposition: A | Discharge status: Home / Self Care | Payer: Medicare Other | Attending: General Surgery | Admitting: General Surgery

## 2018-06-27 ENCOUNTER — Ambulatory Visit: Payer: Medicare Other | Admitting: Registered Nurse

## 2018-06-27 ENCOUNTER — Other Ambulatory Visit: Payer: Self-pay

## 2018-06-27 DIAGNOSIS — E119 Type 2 diabetes mellitus without complications: Secondary | ICD-10-CM | POA: Diagnosis not present

## 2018-06-27 DIAGNOSIS — Z87891 Personal history of nicotine dependence: Secondary | ICD-10-CM | POA: Insufficient documentation

## 2018-06-27 DIAGNOSIS — J45909 Unspecified asthma, uncomplicated: Secondary | ICD-10-CM | POA: Insufficient documentation

## 2018-06-27 DIAGNOSIS — Z7982 Long term (current) use of aspirin: Secondary | ICD-10-CM | POA: Insufficient documentation

## 2018-06-27 DIAGNOSIS — Z79899 Other long term (current) drug therapy: Secondary | ICD-10-CM | POA: Insufficient documentation

## 2018-06-27 DIAGNOSIS — Z803 Family history of malignant neoplasm of breast: Secondary | ICD-10-CM | POA: Insufficient documentation

## 2018-06-27 DIAGNOSIS — N611 Abscess of the breast and nipple: Secondary | ICD-10-CM | POA: Diagnosis present

## 2018-06-27 DIAGNOSIS — Z791 Long term (current) use of non-steroidal anti-inflammatories (NSAID): Secondary | ICD-10-CM | POA: Diagnosis not present

## 2018-06-27 DIAGNOSIS — I10 Essential (primary) hypertension: Secondary | ICD-10-CM | POA: Diagnosis not present

## 2018-06-27 DIAGNOSIS — Z7984 Long term (current) use of oral hypoglycemic drugs: Secondary | ICD-10-CM | POA: Diagnosis not present

## 2018-06-27 DIAGNOSIS — F329 Major depressive disorder, single episode, unspecified: Secondary | ICD-10-CM | POA: Insufficient documentation

## 2018-06-27 DIAGNOSIS — N6452 Nipple discharge: Secondary | ICD-10-CM

## 2018-06-27 HISTORY — PX: BREAST DUCTAL SYSTEM EXCISION: SHX5242

## 2018-06-27 LAB — BASIC METABOLIC PANEL
Anion gap: 7 (ref 5–15)
BUN: 12 mg/dL (ref 8–23)
CALCIUM: 9.2 mg/dL (ref 8.9–10.3)
CO2: 29 mmol/L (ref 22–32)
Chloride: 106 mmol/L (ref 98–111)
Creatinine, Ser: 0.71 mg/dL (ref 0.44–1.00)
GFR calc Af Amer: >60 mL/min (ref >60)
Glucose, Bld: 120 mg/dL — ABNORMAL HIGH (ref 70–99)
Potassium: 3.7 mmol/L (ref 3.5–5.1)
SODIUM: 142 mmol/L (ref 135–145)

## 2018-06-27 LAB — CBC
HCT: 41.2 % (ref 36.0–46.0)
Hemoglobin: 13.8 g/dL (ref 12.0–15.0)
MCH: 29.9 pg (ref 26.0–34.0)
MCHC: 33.5 g/dL (ref 30.0–36.0)
MCV: 89.2 fL (ref 80.0–100.0)
Platelets: 183 10*3/uL (ref 150–400)
RBC: 4.62 MIL/uL (ref 3.87–5.11)
RDW: 13.2 % (ref 11.5–15.5)
WBC: 6.9 10*3/uL (ref 4.0–10.5)
nRBC: 0 % (ref 0.0–0.2)

## 2018-06-27 LAB — GLUCOSE, CAPILLARY
GLUCOSE-CAPILLARY: 115 mg/dL — AB (ref 70–99)
Glucose-Capillary: 138 mg/dL — ABNORMAL HIGH (ref 70–99)

## 2018-06-27 SURGERY — EXCISION DUCTAL SYSTEM BREAST
Anesthesia: General | Laterality: Left

## 2018-06-27 MED ORDER — SODIUM CHLORIDE 0.9 % IV SOLN
INTRAVENOUS | Status: DC
  Administered 2018-06-27: 1000 mL via INTRAVENOUS

## 2018-06-27 MED ORDER — ONDANSETRON HCL 4 MG/2ML IJ SOLN
INTRAMUSCULAR | Status: DC | PRN
  Administered 2018-06-27: 4 mg via INTRAVENOUS

## 2018-06-27 MED ORDER — MEPERIDINE HCL 50 MG/ML IJ SOLN: 6.2500 mg | INTRAMUSCULAR | Status: DC | PRN

## 2018-06-27 MED ORDER — FAMOTIDINE 20 MG PO TABS
20.0000 mg | ORAL_TABLET | Freq: Once | ORAL | Status: AC
  Administered 2018-06-27: 20 mg via ORAL

## 2018-06-27 MED ORDER — FENTANYL CITRATE (PF) 100 MCG/2ML IJ SOLN
INTRAMUSCULAR | Status: DC | PRN
  Administered 2018-06-27 (×4): 25 ug via INTRAVENOUS

## 2018-06-27 MED ORDER — BUPIVACAINE HCL (PF) 0.5 % IJ SOLN
INTRAMUSCULAR | Status: AC
  Filled 2018-06-27: qty 30

## 2018-06-27 MED ORDER — PROPOFOL 10 MG/ML IV BOLUS
INTRAVENOUS | Status: AC
  Filled 2018-06-27: qty 20

## 2018-06-27 MED ORDER — FENTANYL CITRATE (PF) 100 MCG/2ML IJ SOLN
INTRAMUSCULAR | Status: AC
  Filled 2018-06-27: qty 2

## 2018-06-27 MED ORDER — FAMOTIDINE 20 MG PO TABS
ORAL_TABLET | ORAL | Status: AC
  Filled 2018-06-27: qty 1

## 2018-06-27 MED ORDER — LIDOCAINE HCL (CARDIAC) PF 100 MG/5ML IV SOSY
PREFILLED_SYRINGE | INTRAVENOUS | Status: DC | PRN
  Administered 2018-06-27: 100 mg via INTRAVENOUS

## 2018-06-27 MED ORDER — LIDOCAINE HCL (PF) 2 % IJ SOLN
INTRAMUSCULAR | Status: AC
  Filled 2018-06-27: qty 10

## 2018-06-27 MED ORDER — GABAPENTIN 300 MG PO CAPS
ORAL_CAPSULE | ORAL | Status: AC
  Filled 2018-06-27: qty 1

## 2018-06-27 MED ORDER — PROMETHAZINE HCL 25 MG/ML IJ SOLN: 6.2500 mg | INTRAMUSCULAR | Status: DC | PRN

## 2018-06-27 MED ORDER — SULFAMETHOXAZOLE-TRIMETHOPRIM 800-160 MG PO TABS: 1.0000 | ORAL_TABLET | Freq: Two times a day (BID) | ORAL | 0 refills | Status: DC

## 2018-06-27 MED ORDER — DEXAMETHASONE SODIUM PHOSPHATE 10 MG/ML IJ SOLN
INTRAMUSCULAR | Status: AC
  Filled 2018-06-27: qty 1

## 2018-06-27 MED ORDER — BUPIVACAINE-EPINEPHRINE (PF) 0.5% -1:200000 IJ SOLN
INTRAMUSCULAR | Status: DC | PRN
  Administered 2018-06-27: 30 mL via PERINEURAL

## 2018-06-27 MED ORDER — DEXAMETHASONE SODIUM PHOSPHATE 10 MG/ML IJ SOLN
INTRAMUSCULAR | Status: DC | PRN
  Administered 2018-06-27: 5 mg via INTRAVENOUS

## 2018-06-27 MED ORDER — PROPOFOL 10 MG/ML IV BOLUS
INTRAVENOUS | Status: DC | PRN
  Administered 2018-06-27: 150 mg via INTRAVENOUS

## 2018-06-27 MED ORDER — ONDANSETRON HCL 4 MG/2ML IJ SOLN
INTRAMUSCULAR | Status: AC
  Filled 2018-06-27: qty 2

## 2018-06-27 MED ORDER — OXYCODONE-ACETAMINOPHEN 5-325 MG PO TABS: 1.0000 | ORAL_TABLET | ORAL | 0 refills | Status: DC | PRN

## 2018-06-27 MED ORDER — FENTANYL CITRATE (PF) 100 MCG/2ML IJ SOLN: 25.0000 ug | INTRAMUSCULAR | Status: DC | PRN

## 2018-06-27 MED ORDER — EPINEPHRINE PF 1 MG/ML IJ SOLN
INTRAMUSCULAR | Status: AC
  Filled 2018-06-27: qty 1

## 2018-06-27 MED ORDER — ACETAMINOPHEN 10 MG/ML IV SOLN
INTRAVENOUS | Status: DC | PRN
  Administered 2018-06-27: 1000 mg via INTRAVENOUS

## 2018-06-27 MED ORDER — GABAPENTIN 300 MG PO CAPS
300.0000 mg | ORAL_CAPSULE | ORAL | Status: AC
  Administered 2018-06-27: 300 mg via ORAL

## 2018-06-27 SURGICAL SUPPLY — 48 items
BINDER BREAST LRG (GAUZE/BANDAGES/DRESSINGS) IMPLANT
BINDER BREAST MEDIUM (GAUZE/BANDAGES/DRESSINGS) IMPLANT
BINDER BREAST XLRG (GAUZE/BANDAGES/DRESSINGS) IMPLANT
BINDER BREAST XXLRG (GAUZE/BANDAGES/DRESSINGS) IMPLANT
BLADE SURG 15 STRL SS SAFETY (BLADE) ×6 IMPLANT
CANISTER SUCT 1200ML W/VALVE (MISCELLANEOUS) ×3 IMPLANT
CHLORAPREP W/TINT 26 (MISCELLANEOUS) ×3 IMPLANT
CLOSURE WOUND 1/2 X4 (GAUZE/BANDAGES/DRESSINGS) ×1
CNTNR SPEC 2.5X3XGRAD LEK (MISCELLANEOUS)
CONT SPEC 4OZ STER OR WHT (MISCELLANEOUS)
CONTAINER SPEC 2.5X3XGRAD LEK (MISCELLANEOUS) ×1 IMPLANT
COVER PROBE FLX POLY STRL (MISCELLANEOUS) ×1 IMPLANT
COVER WAND RF STERILE (DRAPES) ×1 IMPLANT
DEVICE DUBIN SPECIMEN MAMMOGRA (MISCELLANEOUS) ×1 IMPLANT
DRAPE LAPAROTOMY 100X77 ABD (DRAPES) ×3 IMPLANT
DRSG GAUZE FLUFF 36X18 (GAUZE/BANDAGES/DRESSINGS) ×3 IMPLANT
DRSG TEGADERM 4X4.75 (GAUZE/BANDAGES/DRESSINGS) ×2 IMPLANT
DRSG TELFA 3X8 NADH (GAUZE/BANDAGES/DRESSINGS) ×3 IMPLANT
DRSG TELFA 4X3 1S NADH ST (GAUZE/BANDAGES/DRESSINGS) ×3 IMPLANT
ELECT CAUTERY BLADE TIP 2.5 (TIP) ×3
ELECT REM PT RETURN 9FT ADLT (ELECTROSURGICAL) ×3
ELECTRODE CAUTERY BLDE TIP 2.5 (TIP) ×1 IMPLANT
ELECTRODE REM PT RTRN 9FT ADLT (ELECTROSURGICAL) ×1 IMPLANT
GLOVE BIO SURGEON STRL SZ7.5 (GLOVE) ×5 IMPLANT
GLOVE INDICATOR 8.0 STRL GRN (GLOVE) ×5 IMPLANT
GOWN STRL REUS W/ TWL LRG LVL3 (GOWN DISPOSABLE) ×2 IMPLANT
GOWN STRL REUS W/TWL LRG LVL3 (GOWN DISPOSABLE) ×4
KIT TURNOVER KIT A (KITS) ×3 IMPLANT
LABEL OR SOLS (LABEL) ×3 IMPLANT
MARGIN MAP 10MM (MISCELLANEOUS) ×1 IMPLANT
NDL HYPO 25X1 1.5 SAFETY (NEEDLE) ×1 IMPLANT
NEEDLE HYPO 22GX1.5 SAFETY (NEEDLE) ×3 IMPLANT
NEEDLE HYPO 25X1 1.5 SAFETY (NEEDLE) ×3 IMPLANT
PACK BASIN MINOR ARMC (MISCELLANEOUS) ×3 IMPLANT
PAD DRESSING TELFA 3X8 NADH (GAUZE/BANDAGES/DRESSINGS) IMPLANT
RETRACTOR RING XSMALL (MISCELLANEOUS) ×1 IMPLANT
RTRCTR WOUND ALEXIS 13CM XS SH (MISCELLANEOUS)
SHEARS HARMONIC 9CM CVD (BLADE) ×3 IMPLANT
STRIP CLOSURE SKIN 1/2X4 (GAUZE/BANDAGES/DRESSINGS) ×2 IMPLANT
SUT ETHILON 3-0 FS-10 30 BLK (SUTURE)
SUT VIC AB 2-0 CT1 27 (SUTURE) ×2
SUT VIC AB 2-0 CT1 TAPERPNT 27 (SUTURE) ×2 IMPLANT
SUT VIC AB 4-0 FS2 27 (SUTURE) ×3 IMPLANT
SUTURE EHLN 3-0 FS-10 30 BLK (SUTURE) ×1 IMPLANT
SWABSTK COMLB BENZOIN TINCTURE (MISCELLANEOUS) ×3 IMPLANT
SYR 10ML LL (SYRINGE) ×3 IMPLANT
TAPE TRANSPORE STRL 2 31045 (GAUZE/BANDAGES/DRESSINGS) ×1 IMPLANT
WATER STERILE IRR 1000ML POUR (IV SOLUTION) ×3 IMPLANT

## 2018-06-27 NOTE — Anesthesia Postprocedure Evaluation (Signed)
Anesthesia Post Note  Patient: Brenda Lin  Procedure(s) Performed: EXCISION LEFT BREAST DUCTAL STRUCTURE, DIABETIC (Left )  Patient location during evaluation: PACU Anesthesia Type: General Level of consciousness: awake and alert and oriented Pain management: pain level controlled Vital Signs Assessment: post-procedure vital signs reviewed and stable Respiratory status: spontaneous breathing, nonlabored ventilation and respiratory function stable Cardiovascular status: blood pressure returned to baseline and stable Postop Assessment: no signs of nausea or vomiting Anesthetic complications: no     Last Vitals:  Vitals:   06/27/18 0939 06/27/18 0948  BP: (!) 176/78   Pulse: 72 71  Resp: 16 17  Temp:  36.8 C  SpO2: 99% 100%    Last Pain:  Vitals:   06/27/18 0924  TempSrc:   PainSc: 0-No pain                 Azhar Knope

## 2018-06-27 NOTE — Progress Notes (Signed)
Dr Bary Castilla stated pt ok to go home

## 2018-06-27 NOTE — Anesthesia Preprocedure Evaluation (Signed)
Anesthesia Evaluation  Patient identified by MRN, date of birth, ID band Patient awake    Reviewed: Allergy & Precautions, NPO status , Patient's Chart, lab work & pertinent test results  History of Anesthesia Complications Negative for: history of anesthetic complications  Airway Mallampati: II  TM Distance: >3 FB Neck ROM: Full    Dental no notable dental hx.    Pulmonary asthma , neg sleep apnea, former smoker,    breath sounds clear to auscultation- rhonchi (-) wheezing      Cardiovascular hypertension, Pt. on medications (-) CAD, (-) Past MI, (-) Cardiac Stents and (-) CABG  Rhythm:Regular Rate:Normal - Systolic murmurs and - Diastolic murmurs    Neuro/Psych neg Seizures PSYCHIATRIC DISORDERS Depression negative neurological ROS     GI/Hepatic Neg liver ROS, GERD  ,  Endo/Other  diabetes, Oral Hypoglycemic Agents  Renal/GU negative Renal ROS     Musculoskeletal negative musculoskeletal ROS (+)   Abdominal (+) + obese,   Peds  Hematology negative hematology ROS (+)   Anesthesia Other Findings Past Medical History: No date: Acid reflux No date: Asthma No date: Depression No date: Diabetes mellitus without complication ([place]) 4103: Diverticulitis No date: Hypertension   Reproductive/Obstetrics                             Anesthesia Physical Anesthesia Plan  ASA: III  Anesthesia Plan: General   Post-op Pain Management:    Induction: Intravenous  PONV Risk Score and Plan: 2 and Ondansetron and Dexamethasone  Airway Management Planned: LMA  Additional Equipment:   Intra-op Plan:   Post-operative Plan:   Informed Consent: I have reviewed the patients History and Physical, chart, labs and discussed the procedure including the risks, benefits and alternatives for the proposed anesthesia with the patient or authorized representative who has indicated his/her understanding and  acceptance.     Dental advisory given  Plan Discussed with: CRNA and Anesthesiologist  Anesthesia Plan Comments:         Anesthesia Quick Evaluation

## 2018-06-27 NOTE — Anesthesia Procedure Notes (Signed)
Procedure Name: LMA Insertion Date/Time: 06/27/2018 8:39 AM Performed by: Hedda Slade, CRNA Pre-anesthesia Checklist: Patient identified, Patient being monitored, Timeout performed, Emergency Drugs available and Suction available Patient Re-evaluated:Patient Re-evaluated prior to induction Oxygen Delivery Method: Circle system utilized Preoxygenation: Pre-oxygenation with 100% oxygen Induction Type: IV induction Ventilation: Mask ventilation without difficulty LMA: LMA inserted LMA Size: 3.5 Tube type: Oral Number of attempts: 1 Placement Confirmation: positive ETCO2 and breath sounds checked- equal and bilateral Tube secured with: Tape Dental Injury: Teeth and Oropharynx as per pre-operative assessment

## 2018-06-27 NOTE — Op Note (Signed)
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  Postoperative diagnosis: Same.  Operative procedure: Excision of chronic left nipple areolar abscess.  Operating Surgeon: Hervey Ard, MD.  Anesthesia: General by LMA, Marcaine 0.5% with 1-200,000 notes of epinephrine, 30 cc.  Estimated blood loss: Less than 5 cc.  Clinical note: This patient has developed a chronic abscess involving the base of the nipple near its junction with the areola.  She failed to resolve with a course of oral antibiotics.  She is brought to the operative room for planned debridement.  Operative note: With the patient under adequate general anesthesia the breast was cleansed with ChloraPrep and draped.  Culture was obtained from the thick nipple drainage noted.  The inflammatory processes seem to be superficially located below the medial aspect of the nipple extending down to the base.  It joins the areola.  This was excised and a lateral tennis racquet incision made medially to provide better exposure.  The chronic inflammatory tissue was sharply debrided and with a curette.  Hemostasis was electrocautery.  The deep tissue was approximated to help preserve the breast mound with interrupted 4-0 Vicryl sutures.  The normal breast skin was closed with interrupted 4-0 Vicryl subcuticular sutures.  The skin of the nipple was loosely approximated with interrupted 4-0 nylon sutures.  Telfa and Tegaderm dressing applied.  Cultures in 2016 showed actinobacter and Pseudomonas.  The patient had been treated with 10 days of Levaquin with incomplete resolution.  October 2019 cultures had shown abundant gram-negative rods and gram-positive cocci with white blood cells, normal skin flora.  The patient tolerated the procedure well and was taken recovery in stable condition.

## 2018-06-27 NOTE — Discharge Instructions (Signed)
AMBULATORY SURGERY  DISCHARGE INSTRUCTIONS   1) The drugs that you were given will stay in your system until tomorrow so for the next 24 hours you should not:  A) Drive an automobile B) Make any legal decisions C) Drink any alcoholic beverage   2) You may resume regular meals tomorrow.  Today it is better to start with liquids and gradually work up to solid foods.  You may eat anything you prefer, but it is better to start with liquids, then soup and crackers, and gradually work up to solid foods.   3) Please notify your doctor immediately if you have any unusual bleeding, trouble breathing, redness and pain at the surgery site, drainage, fever, or pain not relieved by medication.    4) Additional Instructions:        Please contact your physician with any problems or Same Day Surgery at [phone], Monday through Friday 6 am to 4 pm, or [place] at [facility] number at [phone].Take Bactrim DS (antibiotic) twice a day.

## 2018-06-27 NOTE — Anesthesia Post-op Follow-up Note (Signed)
Anesthesia QCDR form completed.        

## 2018-06-27 NOTE — H&P (Signed)
No change in clinical history or exam. For left ductal excision/ abscess drainage.

## 2018-06-27 NOTE — Transfer of Care (Signed)
Immediate Anesthesia Transfer of Care Note  Patient: Brenda Lin  Procedure(s) Performed: EXCISION LEFT BREAST DUCTAL STRUCTURE, DIABETIC (Left )  Patient Location: PACU  Anesthesia Type:General  Level of Consciousness: awake, alert  and oriented  Airway & Oxygen Therapy: Patient Spontanous Breathing and Patient connected to face mask oxygen  Post-op Assessment: Report given to RN and Post -op Vital signs reviewed and stable  Post vital signs: Reviewed and stable  Last Vitals:  Vitals Value Taken Time  BP 189/90 06/27/2018  9:11 AM  Temp 36.3 C 06/27/2018  9:11 AM  Pulse 77 06/27/2018  9:11 AM  Resp 15 06/27/2018  9:11 AM  SpO2 100 % 06/27/2018  9:11 AM  Vitals shown include unvalidated device data.  Last Pain:  Vitals:   06/27/18 0911  TempSrc:   PainSc: 0-No pain         Complications: No apparent anesthesia complications

## 2018-06-28 ENCOUNTER — Telehealth: Payer: Self-pay | Admitting: General Surgery

## 2018-06-28 LAB — SURGICAL PATHOLOGY

## 2018-06-28 NOTE — Telephone Encounter (Signed)
Reports doing well.  Pathology was fine. F/U next week as scheduled.

## 2018-06-28 NOTE — Telephone Encounter (Signed)
Patient was calling just to say she just had surgery, but wanted to let everyone know she was doing good, and didn't have much pain.

## 2018-07-01 ENCOUNTER — Encounter: Payer: Self-pay | Admitting: General Surgery

## 2018-07-02 LAB — AEROBIC/ANAEROBIC CULTURE W GRAM STAIN (SURGICAL/DEEP WOUND)

## 2018-07-05 ENCOUNTER — Other Ambulatory Visit: Payer: Self-pay

## 2018-07-05 ENCOUNTER — Encounter: Payer: Self-pay | Admitting: General Surgery

## 2018-07-05 ENCOUNTER — Ambulatory Visit (INDEPENDENT_AMBULATORY_CARE_PROVIDER_SITE_OTHER): Payer: Medicare Other | Admitting: General Surgery

## 2018-07-05 VITALS — BP 132/76 | HR 94 | Ht 65.0 in | Wt 190.0 lb

## 2018-07-05 DIAGNOSIS — N649 Disorder of breast, unspecified: Secondary | ICD-10-CM

## 2018-07-05 NOTE — Patient Instructions (Signed)
The patient is aware to call back for any questions or new concerns.  

## 2018-07-05 NOTE — Progress Notes (Signed)
Patient ID: Brenda Lin, female   DOB: [birth year], 68 y.o.   MRN: [id number]  Chief Complaint  Patient presents with  . Routine Post Op    post op EXCISION LEFT BREAST DUCTAL STRUCTURE,  06/27/2018    HPI Brenda Lin is a 68 y.o. female.  post op excision left breast duct on 06/27/2018. She states she is doing well, mild discomfort.   HPI  Past Medical History:  Diagnosis Date  . Acid reflux   . Asthma   . Depression   . Diabetes mellitus without complication ([place])   . Diverticulitis 2013  . Hypertension     Past Surgical History:  Procedure Laterality Date  . APPENDECTOMY    . BREAST BIOPSY Left 02/2018   u/s bx by Dr Bary Castilla- neg  . BREAST DUCTAL SYSTEM EXCISION Left 06/27/2018   Procedure: EXCISION LEFT BREAST DUCTAL STRUCTURE, DIABETIC;  Surgeon: Robert Bellow, MD;  Location: [facility] ORS;  Service: General;  Laterality: Left;  . COLON RESECTION  2013   Dr Marina Gravel  . COLONOSCOPY  2015   Dr Ernst Breach  . HERNIA REPAIR  [id number]   umbilical  . SHOULDER ARTHROSCOPY W/ ROTATOR CUFF REPAIR Left 2016    Family History  Problem Relation Age of Onset  . Cancer Maternal Grandmother        breast  . Breast cancer Maternal Grandmother   . Breast cancer Sister        43's  . Breast cancer Maternal Aunt   . Breast cancer Cousin        mat cousin    Social History Social History   Tobacco Use  . Smoking status: Former Smoker    Packs/day: 1.00    Types: Cigarettes    Last attempt to quit: 04/21/2011    Years since quitting: 7.2  . Smokeless tobacco: Never Used  Substance Use Topics  . Alcohol use: Yes    Alcohol/week: 0.0 standard drinks    Comment: rare beer  . Drug use: No    Allergies  Allergen Reactions  . Vicodin [Hydrocodone-Acetaminophen] Itching    Current Outpatient Medications  Medication Sig Dispense Refill  . ACCU-CHEK AVIVA PLUS test strip     . albuterol (PROVENTIL HFA;VENTOLIN HFA) 108 (90 Base) MCG/ACT inhaler Inhale 2 puffs into the lungs  every 4 (four) hours as needed for wheezing or shortness of breath. 1 Inhaler 0  . allopurinol (ZYLOPRIM) 100 MG tablet Take 100 mg by mouth daily.    Marland Kitchen amLODipine (NORVASC) 10 MG tablet     . aspirin EC 81 MG tablet Take 81 mg by mouth daily.    Marland Kitchen atorvastatin (LIPITOR) 20 MG tablet   0  . buPROPion (WELLBUTRIN XL) 150 MG 24 hr tablet as needed.     . colchicine 0.6 MG tablet Take 2 tablets (1.2 mg) initially. Repeat with 1 tablet( 0.6 mg) in one hour. Do not take anymore on the first day. On second day start taking 1 daily until gout attack has resolved. 15 tablet 0  . DULoxetine (CYMBALTA) 20 MG capsule duloxetine 20 mg capsule,delayed release    . fluticasone (FLONASE) 50 MCG/ACT nasal spray as needed.     Marland Kitchen losartan (COZAAR) 50 MG tablet Take 50 mg by mouth daily.     . meloxicam (MOBIC) 15 MG tablet Take 1 tablet (15 mg total) by mouth daily. 30 tablet 0  . metFORMIN (GLUCOPHAGE-XR) 500 MG 24 hr tablet Take 500 mg by mouth daily  with breakfast.   0  . mupirocin ointment (BACTROBAN) 2 % Apply two times a day for 7 days. 22 g 0  . tiZANidine (ZANAFLEX) 4 MG capsule Take 1 capsule (4 mg total) by mouth 3 (three) times daily. 21 capsule 0   No current facility-administered medications for this visit.     Review of Systems Review of Systems  Constitutional: Negative.   Respiratory: Negative.   Cardiovascular: Negative.     Blood pressure 132/76, pulse 94, height 5\' 5"  (1.651 m), weight 190 lb (86.2 kg), SpO2 97 %.  Physical Exam Physical Exam Exam conducted with a chaperone present.  Constitutional:      Appearance: Normal appearance.  Chest:       Comments: Sutures removed Skin:    General: Skin is warm and dry.  Neurological:     Mental Status: She is alert and oriented to person, place, and time.  Psychiatric:        Mood and Affect: Mood normal.        Behavior: Behavior normal.     Data Reviewed June 27, 2018 pathology   A. NIPPLE SKIN AND RETROAREOLAR TISSUE,  LEFT; EXCISION:  - SKIN AND SUBCUTANEOUS TISSUE WITH MIXED ACUTE AND CHRONIC  INFLAMMATION, AND GRANULATION TISSUE TYPE CHANGES, COMPATIBLE WITH  ABSCESS CAVITY.  - NEGATIVE FOR ATYPICAL PROLIFERATIVE BREAST DISEASE.   FEW WBC PRESENT, PREDOMINANTLY PMN  RARE GRAM POSITIVE COCCI    Assessment Doing well status post excision of chronic nipple areolar inflammatory process  Plan  Follow up in one month. The patient is aware to call back for any questions or new concerns.    HPI, assessment, plan and physical exam has been scribed under the direction and in the presence of Robert Bellow, MD. [name], RN  I have completed the exam and reviewed the above documentation for accuracy and completeness.  I agree with the above.  Haematologist has been used and any errors in dictation or transcription are unintentional.  Hervey Ard, M.D., F.A.C.S.  Brenda Lin 07/05/2018, 10:15 AM

## 2018-08-02 ENCOUNTER — Other Ambulatory Visit: Payer: Self-pay

## 2018-08-02 ENCOUNTER — Ambulatory Visit (INDEPENDENT_AMBULATORY_CARE_PROVIDER_SITE_OTHER): Payer: Medicare Other | Admitting: General Surgery

## 2018-08-02 ENCOUNTER — Encounter: Payer: Self-pay | Admitting: General Surgery

## 2018-08-02 ENCOUNTER — Encounter: Payer: Medicare Other | Admitting: General Surgery

## 2018-08-02 VITALS — BP 163/92 | HR 90 | Temp 97.9°F | Resp 15 | Ht 65.0 in | Wt 193.2 lb

## 2018-08-02 DIAGNOSIS — N649 Disorder of breast, unspecified: Secondary | ICD-10-CM

## 2018-08-02 NOTE — Patient Instructions (Signed)
Follow up as needed. Mammogram due in January.

## 2018-08-02 NOTE — Progress Notes (Signed)
Patient ID: Brenda Lin, female   DOB: [birth year], 68 y.o.   MRN: [id number]  Chief Complaint  Patient presents with  . Routine Post Op    HPI Brenda Lin is a 68 y.o. female here for a post op visit from left breast ductal excision done on 06/27/18. She still reports some aching with the area but doing well.   HPI  Past Medical History:  Diagnosis Date  . Acid reflux   . Asthma   . Depression   . Diabetes mellitus without complication ([place])   . Diverticulitis 2013  . Hypertension     Past Surgical History:  Procedure Laterality Date  . APPENDECTOMY    . BREAST BIOPSY Left 02/2018   u/s bx by Dr Bary Castilla- neg  . BREAST DUCTAL SYSTEM EXCISION Left 06/27/2018   Procedure: EXCISION LEFT BREAST DUCTAL STRUCTURE, DIABETIC;  Surgeon: Robert Bellow, MD;  Location: [facility] ORS;  Service: General;  Laterality: Left;  . COLON RESECTION  2013   Dr Marina Gravel  . COLONOSCOPY  2015   Dr Ernst Breach  . HERNIA REPAIR  [id number]   umbilical  . SHOULDER ARTHROSCOPY W/ ROTATOR CUFF REPAIR Left 2016    Family History  Problem Relation Age of Onset  . Cancer Maternal Grandmother        breast  . Breast cancer Maternal Grandmother   . Breast cancer Sister        53's  . Breast cancer Maternal Aunt   . Breast cancer Cousin        mat cousin    Social History Social History   Tobacco Use  . Smoking status: Former Smoker    Packs/day: 1.00    Types: Cigarettes    Last attempt to quit: 04/21/2011    Years since quitting: 7.2  . Smokeless tobacco: Never Used  Substance Use Topics  . Alcohol use: Yes    Alcohol/week: 0.0 standard drinks    Comment: rare beer  . Drug use: No    Allergies  Allergen Reactions  . Vicodin [Hydrocodone-Acetaminophen] Itching    Current Outpatient Medications  Medication Sig Dispense Refill  . ACCU-CHEK AVIVA PLUS test strip     . albuterol (PROVENTIL HFA;VENTOLIN HFA) 108 (90 Base) MCG/ACT inhaler Inhale 2 puffs into the lungs every 4 (four) hours as  needed for wheezing or shortness of breath. 1 Inhaler 0  . allopurinol (ZYLOPRIM) 100 MG tablet Take 100 mg by mouth daily.    Marland Kitchen amLODipine (NORVASC) 10 MG tablet     . aspirin EC 81 MG tablet Take 81 mg by mouth daily.    Marland Kitchen atorvastatin (LIPITOR) 20 MG tablet   0  . buPROPion (WELLBUTRIN XL) 150 MG 24 hr tablet as needed.     . colchicine 0.6 MG tablet Take 2 tablets (1.2 mg) initially. Repeat with 1 tablet( 0.6 mg) in one hour. Do not take anymore on the first day. On second day start taking 1 daily until gout attack has resolved. 15 tablet 0  . DULoxetine (CYMBALTA) 20 MG capsule duloxetine 20 mg capsule,delayed release    . fluticasone (FLONASE) 50 MCG/ACT nasal spray as needed.     Marland Kitchen losartan (COZAAR) 50 MG tablet Take 50 mg by mouth daily.     . meloxicam (MOBIC) 15 MG tablet Take 1 tablet (15 mg total) by mouth daily. 30 tablet 0  . metFORMIN (GLUCOPHAGE-XR) 500 MG 24 hr tablet Take 500 mg by mouth daily with breakfast.  0  . mupirocin ointment (BACTROBAN) 2 % Apply two times a day for 7 days. 22 g 0  . tiZANidine (ZANAFLEX) 4 MG capsule Take 1 capsule (4 mg total) by mouth 3 (three) times daily. 21 capsule 0   No current facility-administered medications for this visit.     Review of Systems Review of Systems  Constitutional: Negative.   Respiratory: Negative.   Cardiovascular: Negative.     Blood pressure (!) 163/92, pulse 90, temperature 97.9 F (36.6 C), resp. rate 15, height 5\' 5"  (1.651 m), weight 193 lb 3.2 oz (87.6 kg), SpO2 95 %.  Physical Exam Physical Exam Constitutional:      Appearance: Normal appearance.  Chest:       Comments: Well healing left breast duct excision site  Skin:    General: Skin is warm and dry.  Neurological:     Mental Status: She is alert and oriented to person, place, and time.     Data Reviewed NIPPLE SKIN AND RETROAREOLAR TISSUE, LEFT; EXCISION:  - SKIN AND SUBCUTANEOUS TISSUE WITH MIXED ACUTE AND CHRONIC  INFLAMMATION, AND  GRANULATION TISSUE TYPE CHANGES, COMPATIBLE WITH  ABSCESS CAVITY.  - NEGATIVE FOR ATYPICAL PROLIFERATIVE BREAST DISEASE.   Assessment Doing well post excision of chronic inflammatory tissue behind the left nipple.  Plan  Follow up as needed, mammograms due in January 2021 with her PCP  HPI, Physical Exam, Assessment and Plan have been scribed under the direction and in the presence of Robert Bellow, MD  Concepcion Living, LPN  I have completed the exam and reviewed the above documentation for accuracy and completeness.  I agree with the above.  Haematologist has been used and any errors in dictation or transcription are unintentional.  Hervey Ard, M.D., F.A.C.S.  Forest Gleason Rorey Bisson 08/03/2018, 10:32 AM

## 2018-09-21 ENCOUNTER — Encounter: Payer: Self-pay | Admitting: Emergency Medicine

## 2018-09-21 ENCOUNTER — Ambulatory Visit
Admission: EM | Admit: 2018-09-21 | Discharge: 2018-09-21 | Disposition: A | Discharge status: Home / Self Care | Payer: Medicare Other | Attending: Family Medicine | Admitting: Family Medicine

## 2018-09-21 ENCOUNTER — Other Ambulatory Visit: Payer: Self-pay

## 2018-09-21 DIAGNOSIS — L03313 Cellulitis of chest wall: Secondary | ICD-10-CM

## 2018-09-21 DIAGNOSIS — W57XXXA Bitten or stung by nonvenomous insect and other nonvenomous arthropods, initial encounter: Secondary | ICD-10-CM

## 2018-09-21 MED ORDER — DOXYCYCLINE HYCLATE 100 MG PO CAPS: 100.0000 mg | ORAL_CAPSULE | Freq: Two times a day (BID) | ORAL | 0 refills | Status: DC

## 2018-09-21 NOTE — ED Triage Notes (Signed)
Patient states she pulled a tick off under her left arm 1 week ago. She states now she has a rash under her left arm.

## 2018-09-21 NOTE — Discharge Instructions (Signed)
Antibiotic as prescribed.  Take with food. Be careful in the sun.  Take care  Dr. Lacinda Axon

## 2018-09-21 NOTE — ED Provider Notes (Signed)
[facility]    CSN: [id number] Arrival date & time: 09/21/18  1342  History   Chief Complaint Chief Complaint  Patient presents with  . Rash   HPI  68 year old female presents with the above complaint.  Patient reports that 1 week ago she was bitten by tick.  She successfully removed the tick.  Since then, she has had a rash.  Denies pain.  Mild itching.  Associated redness.  No reports of drainage.  Location: Left chest wall, near the lateral aspect of the left breast.  No fever.  She has been applying antibiotic ointment without resolution.  No known exacerbating factors.  No other associated symptoms.  No other complaints.  PMH, Surgical Hx, Family Hx, Social History reviewed and updated as below.  Past Medical History:  Diagnosis Date  . Acid reflux   . Asthma   . Depression   . Diabetes mellitus without complication ([place])   . Diverticulitis 2013  . Hypertension     Patient Active Problem List   Diagnosis Date Noted  . Lesion of nipple 03/24/2018  . Encounter for screening mammogram for breast cancer 03/07/2018    Past Surgical History:  Procedure Laterality Date  . APPENDECTOMY    . BREAST BIOPSY Left 02/2018   u/s bx by Dr Bary Castilla- neg  . BREAST DUCTAL SYSTEM EXCISION Left 06/27/2018   Procedure: EXCISION LEFT BREAST DUCTAL STRUCTURE, DIABETIC;  Surgeon: Robert Bellow, MD;  Location: [facility] ORS;  Service: General;  Laterality: Left;  . COLON RESECTION  2013   Dr Marina Gravel  . COLONOSCOPY  2015   Dr Ernst Breach  . HERNIA REPAIR  8250   umbilical  . SHOULDER ARTHROSCOPY W/ ROTATOR CUFF REPAIR Left 2016    OB History    Gravida  2   Para  2   Term      Preterm      AB      Living  2     SAB      TAB      Ectopic      Multiple      Live Births           Obstetric Comments  1st Menstrual Cycle:  13 1st Pregnancy:  20          Home Medications    Prior to Admission medications   Medication Sig Start Date End Date Taking?  Authorizing Provider  ACCU-CHEK AVIVA PLUS test strip  07/11/14  Yes [provider]  albuterol (PROVENTIL HFA;VENTOLIN HFA) 108 (90 Base) MCG/ACT inhaler Inhale 2 puffs into the lungs every 4 (four) hours as needed for wheezing or shortness of breath. 5/39/76  Yes Defelice, Jeanett Schlein, NP  allopurinol (ZYLOPRIM) 100 MG tablet Take 100 mg by mouth daily.   Yes [provider]  amLODipine (NORVASC) 10 MG tablet  06/22/18  Yes [provider]  aspirin EC 81 MG tablet Take 81 mg by mouth daily.   Yes [provider]  atorvastatin (LIPITOR) 20 MG tablet  02/21/18  Yes [provider]  buPROPion (WELLBUTRIN XL) 150 MG 24 hr tablet as needed.  06/22/18  Yes [provider]  colchicine 0.6 MG tablet Take 2 tablets (1.2 mg) initially. Repeat with 1 tablet( 0.6 mg) in one hour. Do not take anymore on the first day. On second day start taking 1 daily until gout attack has resolved. 09/08/16  Yes Lorin Picket, PA-C  DULoxetine (CYMBALTA) 20 MG capsule duloxetine 20  mg capsule,delayed release   Yes [provider]  fluticasone (FLONASE) 50 MCG/ACT nasal spray as needed.  10/01/14  Yes [provider]  losartan (COZAAR) 50 MG tablet Take 50 mg by mouth daily.    Yes [provider]  meloxicam (MOBIC) 15 MG tablet Take 1 tablet (15 mg total) by mouth daily. 06/24/18  Yes Norval Gable, MD  metFORMIN (GLUCOPHAGE-XR) 500 MG 24 hr tablet Take 500 mg by mouth daily with breakfast.  09/13/14  Yes [provider]  mupirocin ointment (BACTROBAN) 2 % Apply two times a day for 7 days. 02/28/18  Yes Marylene Land, NP  tiZANidine (ZANAFLEX) 4 MG capsule Take 1 capsule (4 mg total) by mouth 3 (three) times daily. 05/05/18  Yes Lorin Picket, PA-C  doxycycline (VIBRAMYCIN) 100 MG capsule Take 1 capsule (100 mg total) by mouth 2 (two) times daily. 09/21/18   Coral Spikes, DO    Family History Family History  Problem Relation Age of Onset   . Cancer Maternal Grandmother        breast  . Breast cancer Maternal Grandmother   . Breast cancer Sister        52's  . Breast cancer Maternal Aunt   . Breast cancer Cousin        mat cousin    Social History Social History   Tobacco Use  . Smoking status: Former Smoker    Packs/day: 1.00    Types: Cigarettes    Last attempt to quit: 04/21/2011    Years since quitting: 7.4  . Smokeless tobacco: Never Used  Substance Use Topics  . Alcohol use: Yes    Alcohol/week: 0.0 standard drinks    Comment: rare beer  . Drug use: No     Allergies   Vicodin [hydrocodone-acetaminophen]   Review of Systems Review of Systems  Constitutional: Negative.   Skin: Positive for rash.   Physical Exam Triage Vital Signs ED Triage Vitals [09/21/18 1348]  Enc Vitals Group     BP (!) 183/100     Pulse Rate 93     Resp 18     Temp 98.3 F (36.8 C)     Temp Source Oral     SpO2 98 %     Weight 190 lb (86.2 kg)     Height 5\' 5"  (1.651 m)     Head Circumference      Peak Flow      Pain Score 0     Pain Loc      Pain Edu?      Excl. in [place]?    No data found.  Updated Vital Signs BP (!) 183/100 (BP Location: Right Arm)   Pulse 93   Temp 98.3 F (36.8 C) (Oral)   Resp 18   Ht 5\' 5"  (1.651 m)   Wt 86.2 kg   SpO2 98%   BMI 31.62 kg/m   Visual Acuity Right Eye Distance:   Left Eye Distance:   Bilateral Distance:    Right Eye Near:   Left Eye Near:    Bilateral Near:     Physical Exam Vitals signs and nursing note reviewed.  Constitutional:      General: She is not in acute distress.    Appearance: Normal appearance. She is obese.  HENT:     Head: Normocephalic and atraumatic.  Eyes:     General:        Right eye: No discharge.  Left eye: No discharge.     Conjunctiva/sclera: Conjunctivae normal.  Pulmonary:     Effort: Pulmonary effort is normal. No respiratory distress.  Chest:       Comments: Patient with an area of erythema and induration at the  labelled location.  No fluctuance. Neurological:     Mental Status: She is alert.  Psychiatric:        Mood and Affect: Mood normal.        Behavior: Behavior normal.    UC Treatments / Results  Labs (all labs ordered are listed, but only abnormal results are displayed) Labs Reviewed - No data to display  EKG None  Radiology No results found.  Procedures Procedures (including critical care time)  Medications Ordered in UC Medications - No data to display  Initial Impression / Assessment and Plan / UC Course  I have reviewed the triage vital signs and the nursing notes.  Pertinent labs & imaging results that were available during my care of the patient were reviewed by me and considered in my medical decision making (see chart for details).    68 year old female presents with a recent tick bite.  Associated cellulitis.  Placing on doxycycline.  Final Clinical Impressions(s) / UC Diagnoses   Final diagnoses:  Tick bite, initial encounter  Cellulitis of chest wall     Discharge Instructions     Antibiotic as prescribed.  Take with food. Be careful in the sun.  Take care  Dr. Lacinda Axon    ED Prescriptions    Medication Sig Dispense Auth. Provider   doxycycline (VIBRAMYCIN) 100 MG capsule Take 1 capsule (100 mg total) by mouth 2 (two) times daily. 20 capsule Coral Spikes, DO     Controlled Substance Prescriptions [place]  Controlled Substance Registry consulted? Not Applicable   Coral Spikes, DO 09/21/18 1519

## 2018-10-25 ENCOUNTER — Other Ambulatory Visit
Admission: RE | Admit: 2018-10-25 | Discharge: 2018-10-25 | Disposition: A | Discharge status: Home / Self Care | Payer: Medicare Other | Source: Ambulatory Visit | Attending: Gastroenterology | Admitting: Gastroenterology

## 2018-11-18 ENCOUNTER — Other Ambulatory Visit
Admission: RE | Admit: 2018-11-18 | Discharge: 2018-11-18 | Disposition: A | Discharge status: Home / Self Care | Payer: Medicare Other | Source: Ambulatory Visit | Attending: Gastroenterology | Admitting: Gastroenterology

## 2018-11-18 ENCOUNTER — Other Ambulatory Visit: Payer: Self-pay

## 2018-11-18 DIAGNOSIS — Z20828 Contact with and (suspected) exposure to other viral communicable diseases: Secondary | ICD-10-CM | POA: Diagnosis not present

## 2018-11-18 DIAGNOSIS — Z01812 Encounter for preprocedural laboratory examination: Secondary | ICD-10-CM | POA: Diagnosis present

## 2018-11-19 LAB — SARS CORONAVIRUS 2 (TAT 6-24 HRS): SARS Coronavirus 2: NEGATIVE

## 2018-11-21 ENCOUNTER — Encounter: Payer: Self-pay | Admitting: *Deleted

## 2018-11-22 ENCOUNTER — Other Ambulatory Visit: Payer: Self-pay

## 2018-11-22 ENCOUNTER — Encounter: Payer: Self-pay | Admitting: Certified Registered Nurse Anesthetist

## 2018-11-22 ENCOUNTER — Ambulatory Visit: Payer: Medicare Other | Admitting: Anesthesiology

## 2018-11-22 ENCOUNTER — Ambulatory Visit
Admission: RE | Admit: 2018-11-22 | Discharge: 2018-11-22 | Disposition: A | Discharge status: Home / Self Care | Payer: Medicare Other | Attending: Gastroenterology | Admitting: Gastroenterology

## 2018-11-22 ENCOUNTER — Encounter
Admission: RE | Disposition: A | Discharge status: Home / Self Care | Payer: Self-pay | Source: Home / Self Care | Attending: Gastroenterology

## 2018-11-22 DIAGNOSIS — Z7982 Long term (current) use of aspirin: Secondary | ICD-10-CM | POA: Insufficient documentation

## 2018-11-22 DIAGNOSIS — Z7951 Long term (current) use of inhaled steroids: Secondary | ICD-10-CM | POA: Diagnosis not present

## 2018-11-22 DIAGNOSIS — Z791 Long term (current) use of non-steroidal anti-inflammatories (NSAID): Secondary | ICD-10-CM | POA: Diagnosis not present

## 2018-11-22 DIAGNOSIS — Z7984 Long term (current) use of oral hypoglycemic drugs: Secondary | ICD-10-CM | POA: Diagnosis not present

## 2018-11-22 DIAGNOSIS — Z79899 Other long term (current) drug therapy: Secondary | ICD-10-CM | POA: Diagnosis not present

## 2018-11-22 DIAGNOSIS — Z980 Intestinal bypass and anastomosis status: Secondary | ICD-10-CM | POA: Insufficient documentation

## 2018-11-22 DIAGNOSIS — E119 Type 2 diabetes mellitus without complications: Secondary | ICD-10-CM | POA: Insufficient documentation

## 2018-11-22 DIAGNOSIS — J45909 Unspecified asthma, uncomplicated: Secondary | ICD-10-CM | POA: Insufficient documentation

## 2018-11-22 DIAGNOSIS — K5730 Diverticulosis of large intestine without perforation or abscess without bleeding: Secondary | ICD-10-CM | POA: Diagnosis not present

## 2018-11-22 DIAGNOSIS — I10 Essential (primary) hypertension: Secondary | ICD-10-CM | POA: Insufficient documentation

## 2018-11-22 DIAGNOSIS — F419 Anxiety disorder, unspecified: Secondary | ICD-10-CM | POA: Diagnosis not present

## 2018-11-22 DIAGNOSIS — Z1211 Encounter for screening for malignant neoplasm of colon: Secondary | ICD-10-CM | POA: Diagnosis not present

## 2018-11-22 DIAGNOSIS — Z86010 Personal history of colonic polyps: Secondary | ICD-10-CM | POA: Insufficient documentation

## 2018-11-22 DIAGNOSIS — F329 Major depressive disorder, single episode, unspecified: Secondary | ICD-10-CM | POA: Insufficient documentation

## 2018-11-22 DIAGNOSIS — D125 Benign neoplasm of sigmoid colon: Secondary | ICD-10-CM | POA: Diagnosis not present

## 2018-11-22 DIAGNOSIS — Z87891 Personal history of nicotine dependence: Secondary | ICD-10-CM | POA: Diagnosis not present

## 2018-11-22 HISTORY — DX: Unspecified osteoarthritis, unspecified site: M19.90

## 2018-11-22 HISTORY — DX: Chronic rhinitis: J31.0

## 2018-11-22 HISTORY — DX: Bronchitis, not specified as acute or chronic: J40

## 2018-11-22 HISTORY — PX: COLONOSCOPY WITH PROPOFOL: SHX5780

## 2018-11-22 HISTORY — DX: Anxiety disorder, unspecified: F41.9

## 2018-11-22 LAB — GLUCOSE, CAPILLARY: Glucose-Capillary: 132 mg/dL — ABNORMAL HIGH (ref 70–99)

## 2018-11-22 SURGERY — COLONOSCOPY WITH PROPOFOL
Anesthesia: General

## 2018-11-22 MED ORDER — PROPOFOL 500 MG/50ML IV EMUL
INTRAVENOUS | Status: AC
  Filled 2018-11-22: qty 50

## 2018-11-22 MED ORDER — LIDOCAINE HCL (CARDIAC) PF 100 MG/5ML IV SOSY
PREFILLED_SYRINGE | INTRAVENOUS | Status: DC | PRN
  Administered 2018-11-22: 50 mg via INTRAVENOUS

## 2018-11-22 MED ORDER — PROPOFOL 500 MG/50ML IV EMUL
INTRAVENOUS | Status: DC | PRN
  Administered 2018-11-22: 130 ug/kg/min via INTRAVENOUS

## 2018-11-22 MED ORDER — PROPOFOL 10 MG/ML IV BOLUS
INTRAVENOUS | Status: DC | PRN
  Administered 2018-11-22: 70 mg via INTRAVENOUS
  Administered 2018-11-22: 23 mg via INTRAVENOUS

## 2018-11-22 MED ORDER — LIDOCAINE HCL (PF) 2 % IJ SOLN
INTRAMUSCULAR | Status: AC
  Filled 2018-11-22: qty 10

## 2018-11-22 MED ORDER — SODIUM CHLORIDE 0.9 % IV SOLN: INTRAVENOUS | Status: DC

## 2018-11-22 MED ORDER — SODIUM CHLORIDE 0.9 % IV SOLN
INTRAVENOUS | Status: DC
  Administered 2018-11-22: 1000 mL via INTRAVENOUS

## 2018-11-22 NOTE — Op Note (Signed)
[facility] Gastroenterology Patient Name: Brenda Lin Procedure Date: 11/22/2018 7:24 AM MRN: [id number] Account #: [id number] Date of Birth: 1951/03/13 Admit Type: Outpatient Age: 68 Room: [facility] ENDO ROOM 3 Gender: Female Note Status: Finalized Procedure:            Colonoscopy Indications:          Personal history of colonic polyps Providers:            Lollie Sails, MD Referring MD:         Leandrew Koyanagi, MD (Referring MD) Medicines:            Monitored Anesthesia Care Complications:        No immediate complications. Procedure:            Pre-Anesthesia Assessment:                       - ASA Grade Assessment: III - A patient with severe                        systemic disease.                       After obtaining informed consent, the colonoscope was                        passed under direct vision. Throughout the procedure,                        the patient's blood pressure, pulse, and oxygen                        saturations were monitored continuously. The                        Colonoscope was introduced through the anus and                        advanced to the the cecum, identified by appendiceal                        orifice and ileocecal valve. The quality of the bowel                        preparation was good. Findings:      A 5 mm polyp was found in the proximal ascending colon. The polyp was       semi-pedunculated. The polyp was removed with a cold snare. Resection       was complete, but the polyp tissue was not retrieved.      A 2 mm polyp was found in the sigmoid colon distal sigmoid colon. The       polyp was sessile. The polyp was removed with a cold biopsy forceps.       Resection and retrieval were complete.      There was evidence of a prior end-to-end colo-colonic anastomosis at 20       cm proximal to the anus. This was patent and was characterized by       erythema and visible sutures. The anastomosis was  traversed.      The retroflexed view of the distal rectum and anal verge was normal and  showed no anal or rectal abnormalities.      The digital rectal exam was normal.      Many small and large-mouthed diverticula were found in the sigmoid       colon, descending colon, transverse colon and ascending colon. Impression:           - One 5 mm polyp in the proximal ascending colon,                        removed with a cold snare. Complete resection. Polyp                        tissue not retrieved.                       - One 2 mm polyp in the sigmoid colon in the distal                        sigmoid colon, removed with a cold biopsy forceps.                        Resected and retrieved.                       - Patent end-to-end colo-colonic anastomosis,                        characterized by erythema and visible sutures.                       - The distal rectum and anal verge are normal on                        retroflexion view. Recommendation:       - Discharge patient to home.                       - Repeat colonoscopy in 5 years for adenoma                        surveillance.                       - Use Citrucel one tablespoon PO daily daily. Procedure Code(s):    --- Professional ---                       [phone], Colonoscopy, flexible; with removal of tumor(s),                        polyp(s), or other lesion(s) by snare technique                       45380, 77, Colonoscopy, flexible; with biopsy, single                        or multiple Diagnosis Code(s):    --- Professional ---                       K63.5, Polyp of colon  Z98.0, Intestinal bypass and anastomosis status                       Z86.010, Personal history of colonic polyps CPT copyright 2019 American Medical Association. All rights reserved. The codes documented in this report are preliminary and upon coder review may  be revised to meet current compliance requirements. Lollie Sails, MD 11/22/2018 8:13:47 AM This report has been signed electronically. Number of Addenda: 0 Note Initiated On: 11/22/2018 7:24 AM Scope Withdrawal Time: 0 hours 10 minutes 39 seconds  Total Procedure Duration: 0 hours 22 minutes 28 seconds       [facility]

## 2018-11-22 NOTE — Anesthesia Preprocedure Evaluation (Addendum)
Anesthesia Evaluation  Patient identified by MRN, date of birth, ID band Patient awake    Reviewed: Allergy & Precautions, H&P , NPO status , Patient's Chart, lab work & pertinent test results  Airway Mallampati: II  TM Distance: >3 FB     Dental  (+) Chipped, Missing   Pulmonary asthma , former smoker,           Cardiovascular hypertension,      Neuro/Psych negative neurological ROS  negative psych ROS   GI/Hepatic Neg liver ROS, GERD  Controlled,  Endo/Other  diabetes, Type 2, Oral Hypoglycemic Agents  Renal/GU negative Renal ROS  negative genitourinary   Musculoskeletal   Abdominal   Peds  Hematology negative hematology ROS (+)   Anesthesia Other Findings Past Medical History: No date: Acid reflux No date: Anxiety No date: Arthritis No date: Asthma No date: Bronchitis No date: Depression No date: Diabetes mellitus without complication ([place]) 4967: Diverticulitis No date: Hypertension No date: Rhinitis  Past Surgical History: No date: APPENDECTOMY 02/2018: BREAST BIOPSY; Left     Comment:  u/s bx by Dr Bary Castilla- neg 06/27/2018: [place]; Left     Comment:  Procedure: EXCISION LEFT BREAST DUCTAL STRUCTURE,               DIABETIC;  Surgeon: Robert Bellow, MD;  Location:               [facility] ORS;  Service: General;  Laterality: Left; 2013: COLON RESECTION     Comment:  Dr Marina Gravel 2015: COLONOSCOPY     Comment:  Dr Ernst Breach No date: diverticulosis 2013: HERNIA REPAIR     Comment:  umbilical 5916: SHOULDER ARTHROSCOPY W/ ROTATOR CUFF REPAIR; Left     Reproductive/Obstetrics negative OB ROS                            Anesthesia Physical Anesthesia Plan  ASA: III  Anesthesia Plan: General   Post-op Pain Management:    Induction:   PONV Risk Score and Plan: Propofol infusion and TIVA  Airway Management Planned:   Additional Equipment:   Intra-op  Plan:   Post-operative Plan:   Informed Consent: I have reviewed the patients History and Physical, chart, labs and discussed the procedure including the risks, benefits and alternatives for the proposed anesthesia with the patient or authorized representative who has indicated his/her understanding and acceptance.     Dental Advisory Given  Plan Discussed with: Anesthesiologist and CRNA  Anesthesia Plan Comments:         Anesthesia Quick Evaluation

## 2018-11-22 NOTE — Anesthesia Post-op Follow-up Note (Signed)
Anesthesia QCDR form completed.        

## 2018-11-22 NOTE — H&P (Signed)
Outpatient short stay form Pre-procedure 11/22/2018 7:32 AM Lollie Sails MD  Primary Physician: Rico Ala, PA  Reason for visit: Colonoscopy  History of present illness: Patient is a 68 year old female presenting today for colonoscopy in regards to a personal history of adenomatous colon polyps.  She is also had a left hemicolectomy for diverticulitis in approximately 2013 with a ventral herniorrhaphy/repair afterwards.  The herniorrhaphy was done at [facility].  She does have a history of multiple polyps on previous colonoscopies.  She tolerated her prep well.  She takes no aspirin or blood thinning agent with the exception of 81 mg aspirin that was held today.    Current Facility-Administered Medications:  .  0.9 %  sodium chloride infusion, , Intravenous, Continuous, Lollie Sails, MD, Last Rate: 20 mL/hr at 11/22/18 0718, 1,000 mL at 11/22/18 0718 .  0.9 %  sodium chloride infusion, , Intravenous, Continuous, Lollie Sails, MD  Medications Prior to Admission  Medication Sig Dispense Refill Last Dose  . ACCU-CHEK AVIVA PLUS test strip    11/21/2018 at Unknown time  . albuterol (PROVENTIL HFA;VENTOLIN HFA) 108 (90 Base) MCG/ACT inhaler Inhale 2 puffs into the lungs every 4 (four) hours as needed for wheezing or shortness of breath. 1 Inhaler 0 Past Week at Unknown time  . allopurinol (ZYLOPRIM) 100 MG tablet Take 100 mg by mouth daily.   11/21/2018 at Unknown time  . amLODipine (NORVASC) 10 MG tablet    11/21/2018 at Unknown time  . aspirin EC 81 MG tablet Take 81 mg by mouth daily.   Past Week at Unknown time  . atorvastatin (LIPITOR) 20 MG tablet   0 Past Week at Unknown time  . buPROPion (WELLBUTRIN XL) 150 MG 24 hr tablet as needed.    Past Week at Unknown time  . busPIRone (BUSPAR) 5 MG tablet Take 5 mg by mouth daily.   Past Week at Unknown time  . colchicine 0.6 MG tablet Take 2 tablets (1.2 mg) initially. Repeat with 1 tablet( 0.6 mg) in one hour. Do not take anymore on the  first day. On second day start taking 1 daily until gout attack has resolved. 15 tablet 0 Past Week at Unknown time  . doxycycline (VIBRAMYCIN) 100 MG capsule Take 1 capsule (100 mg total) by mouth 2 (two) times daily. 20 capsule 0 Past Week at Unknown time  . DULoxetine (CYMBALTA) 20 MG capsule duloxetine 20 mg capsule,delayed release   Past Week at Unknown time  . fluticasone (FLONASE) 50 MCG/ACT nasal spray as needed.    Past Week at Unknown time  . fluticasone (FLOVENT HFA) 110 MCG/ACT inhaler Inhale 2 puffs into the lungs 2 (two) times daily.   Past Week at Unknown time  . ibuprofen (ADVIL) 800 MG tablet Take 800 mg by mouth 3 (three) times daily.   Past Week at Unknown time  . loratadine (CLARITIN) 10 MG tablet Take 10 mg by mouth daily.   Past Week at Unknown time  . losartan (COZAAR) 50 MG tablet Take 50 mg by mouth daily.    Past Week at Unknown time  . meloxicam (MOBIC) 15 MG tablet Take 1 tablet (15 mg total) by mouth daily. 30 tablet 0 Past Week at Unknown time  . metFORMIN (GLUCOPHAGE-XR) 500 MG 24 hr tablet Take 500 mg by mouth daily with breakfast.   0 11/21/2018 at Unknown time  . montelukast (SINGULAIR) 10 MG tablet Take 10 mg by mouth at bedtime.   Past Week at  Unknown time  . mupirocin ointment (BACTROBAN) 2 % Apply two times a day for 7 days. 22 g 0 Past Week at Unknown time  . omega-3 acid ethyl esters (LOVAZA) 1 g capsule Take 1,000 mg by mouth 2 (two) times daily.   Past Week at Unknown time  . tiZANidine (ZANAFLEX) 4 MG capsule Take 1 capsule (4 mg total) by mouth 3 (three) times daily. 21 capsule 0 Past Week at Unknown time     Allergies  Allergen Reactions  . Vicodin [Hydrocodone-Acetaminophen] Itching     Past Medical History:  Diagnosis Date  . Acid reflux   . Anxiety   . Arthritis   . Asthma   . Bronchitis   . Depression   . Diabetes mellitus without complication ([place])   . Diverticulitis 2013  . Hypertension   . Rhinitis     Review of systems:       Physical Exam    Heart and lungs: Regular rate and rhythm without rub or gallop lungs are bilaterally clear    HEENT: Normocephalic atraumatic eyes are anicteric    Other:    Pertinant exam for procedure: Soft nontender nondistended bowel sounds positive normoactive.  There is a midline incision extending from the umbilicus toward the pelvic arch.  Patient states that she is numb and tender in this area from her surgery.  No masses or rebound.    Planned proceedures: Colonoscopy and indicated procedures. I have discussed the risks benefits and complications of procedures to include not limited to bleeding, infection, perforation and the risk of sedation and the patient wishes to proceed.    Lollie Sails, MD Gastroenterology 11/22/2018  7:32 AM

## 2018-11-22 NOTE — Transfer of Care (Signed)
Immediate Anesthesia Transfer of Care Note  Patient: Brenda Lin  Procedure(s) Performed: COLONOSCOPY WITH PROPOFOL (N/A )  Patient Location: PACU and Endoscopy Unit  Anesthesia Type:General  Level of Consciousness: awake, alert , oriented and patient cooperative  Airway & Oxygen Therapy: Patient Spontanous Breathing  Post-op Assessment: Report given to RN and Post -op Vital signs reviewed and stable  Post vital signs: Reviewed and stable  Last Vitals:  Vitals Value Taken Time  BP 109/57 11/22/18 0814  Temp 36.1 C 11/22/18 0813  Pulse 84 11/22/18 0813  Resp 15 11/22/18 0813  SpO2 96 % 11/22/18 0813  Vitals shown include unvalidated device data.  Last Pain:  Vitals:   11/22/18 0702  TempSrc: Tympanic  PainSc: 0-No pain         Complications: No apparent anesthesia complications

## 2018-11-22 NOTE — Anesthesia Postprocedure Evaluation (Signed)
Anesthesia Post Note  Patient: Brenda Lin  Procedure(s) Performed: COLONOSCOPY WITH PROPOFOL (N/A )  Patient location during evaluation: PACU Anesthesia Type: General Level of consciousness: awake and alert Pain management: pain level controlled Vital Signs Assessment: post-procedure vital signs reviewed and stable Respiratory status: spontaneous breathing, nonlabored ventilation and respiratory function stable Cardiovascular status: blood pressure returned to baseline and stable Postop Assessment: no apparent nausea or vomiting Anesthetic complications: no     Last Vitals:  Vitals:   11/22/18 0823 11/22/18 0843  BP: (!) 171/94 (!) 180/98  Pulse:    Resp:    Temp:    SpO2:      Last Pain:  Vitals:   11/22/18 0843  TempSrc:   PainSc: 0-No pain                 Durenda Hurt

## 2018-11-23 ENCOUNTER — Encounter: Payer: Self-pay | Admitting: Gastroenterology

## 2018-11-23 LAB — SURGICAL PATHOLOGY

## 2018-11-25 DIAGNOSIS — E119 Type 2 diabetes mellitus without complications: Secondary | ICD-10-CM | POA: Diagnosis not present

## 2019-01-03 ENCOUNTER — Other Ambulatory Visit: Payer: Self-pay

## 2019-01-03 ENCOUNTER — Ambulatory Visit
Admission: EM | Admit: 2019-01-03 | Discharge: 2019-01-03 | Disposition: A | Discharge status: Home / Self Care | Payer: Medicare Other | Attending: Family Medicine | Admitting: Family Medicine

## 2019-01-03 ENCOUNTER — Ambulatory Visit (INDEPENDENT_AMBULATORY_CARE_PROVIDER_SITE_OTHER): Payer: Medicare Other

## 2019-01-03 ENCOUNTER — Encounter: Payer: Self-pay | Admitting: Emergency Medicine

## 2019-01-03 DIAGNOSIS — M79675 Pain in left toe(s): Secondary | ICD-10-CM | POA: Diagnosis not present

## 2019-01-03 DIAGNOSIS — W228XXA Striking against or struck by other objects, initial encounter: Secondary | ICD-10-CM

## 2019-01-03 MED ORDER — NAPROXEN 500 MG PO TABS: 500.0000 mg | ORAL_TABLET | Freq: Two times a day (BID) | ORAL | 0 refills | Status: DC | PRN

## 2019-01-03 NOTE — Discharge Instructions (Addendum)
Rest, Ice, elevation.  Medication as needed.  Take care  Dr. Lacinda Axon

## 2019-01-03 NOTE — ED Triage Notes (Signed)
Patient in today c/o left 4th toe pain after hitting her toe on a wooden chair last night.

## 2019-01-03 NOTE — ED Provider Notes (Signed)
[facility]    CSN: [id number] Arrival date & time: 01/03/19  1324  History   Chief Complaint Chief Complaint  Patient presents with  . Toe Injury    left 4th toe   HPI  68 year old female presents with the above complaint.  Patient states that last night she hit her left fourth toe on the corner of a wooden chair.  Patient reports severe pain at this toe.  She reports that she had difficulty sleeping last night due to the pain.  No medications or interventions tried.  Worse with palpation and range of motion.  No relieving factors.  She is concerned that she may have fractured her toe.  No other associated symptoms.  No other complaints.  PMH, Surgical Hx, Family Hx, Social History reviewed and updated as below.  Past Medical History:  Diagnosis Date  . Acid reflux   . Anxiety   . Arthritis   . Asthma   . Bronchitis   . Depression   . Diabetes mellitus without complication ([place])   . Diverticulitis 2013  . Hypertension   . Rhinitis     Patient Active Problem List   Diagnosis Date Noted  . Lesion of nipple 03/24/2018  . Encounter for screening mammogram for breast cancer 03/07/2018    Past Surgical History:  Procedure Laterality Date  . APPENDECTOMY    . BREAST BIOPSY Left 02/2018   u/s bx by Dr Bary Castilla- neg  . BREAST DUCTAL SYSTEM EXCISION Left 06/27/2018   Procedure: EXCISION LEFT BREAST DUCTAL STRUCTURE, DIABETIC;  Surgeon: Robert Bellow, MD;  Location: [facility] ORS;  Service: General;  Laterality: Left;  . COLON RESECTION  2013   Dr Marina Gravel  . COLONOSCOPY  2015   Dr Ernst Breach  . COLONOSCOPY WITH PROPOFOL N/A 11/22/2018   Procedure: COLONOSCOPY WITH PROPOFOL;  Surgeon: Lollie Sails, MD;  Location: [facility] ENDOSCOPY;  Service: Endoscopy;  Laterality: N/A;  . diverticulosis    . HERNIA REPAIR  0000000   umbilical  . SHOULDER ARTHROSCOPY W/ ROTATOR CUFF REPAIR Left 2016    OB History    Gravida  2   Para  2   Term      Preterm      AB      Living  2     SAB      TAB      Ectopic      Multiple      Live Births           Obstetric Comments  1st Menstrual Cycle:  13 1st Pregnancy:  20          Home Medications    Prior to Admission medications   Medication Sig Start Date End Date Taking? Authorizing Provider  albuterol (PROVENTIL HFA;VENTOLIN HFA) 108 (90 Base) MCG/ACT inhaler Inhale 2 puffs into the lungs every 4 (four) hours as needed for wheezing or shortness of breath. [id number]  Yes Defelice, Jeanett Schlein, NP  allopurinol (ZYLOPRIM) 100 MG tablet Take 100 mg by mouth daily.   Yes [provider]  aspirin EC 81 MG tablet Take 81 mg by mouth daily.   Yes [provider]  atorvastatin (LIPITOR) 20 MG tablet  02/21/18  Yes [provider]  buPROPion (WELLBUTRIN XL) 150 MG 24 hr tablet as needed.  06/22/18  Yes [provider]  busPIRone (BUSPAR) 5 MG tablet Take 5 mg by mouth daily.   Yes [provider]  fluticasone (FLONASE) 50 MCG/ACT nasal spray  as needed.  10/01/14  Yes [provider]  fluticasone (FLOVENT HFA) 110 MCG/ACT inhaler Inhale 2 puffs into the lungs 2 (two) times daily.   Yes [provider]  loratadine (CLARITIN) 10 MG tablet Take 10 mg by mouth daily.   Yes [provider]  losartan (COZAAR) 50 MG tablet Take 50 mg by mouth daily.    Yes [provider]  metFORMIN (GLUCOPHAGE-XR) 500 MG 24 hr tablet Take 500 mg by mouth daily with breakfast.  09/13/14  Yes [provider]  montelukast (SINGULAIR) 10 MG tablet Take 10 mg by mouth at bedtime.   Yes [provider]  Omega-3 Fatty Acids (FISH OIL) 500 MG CAPS Take by mouth.   Yes [provider]  ACCU-CHEK AVIVA PLUS test strip  07/11/14   [provider]  naproxen (NAPROSYN) 500 MG tablet Take 1 tablet (500 mg total) by mouth 2 (two) times daily as needed for moderate pain. 01/03/19   Coral Spikes, DO  colchicine 0.6 MG tablet Take 2 tablets  (1.2 mg) initially. Repeat with 1 tablet( 0.6 mg) in one hour. Do not take anymore on the first day. On second day start taking 1 daily until gout attack has resolved. 09/08/16 01/03/19  Lorin Picket, PA-C  DULoxetine (CYMBALTA) 20 MG capsule duloxetine 20 mg capsule,delayed release  01/03/19  [provider]  omega-3 acid ethyl esters (LOVAZA) 1 g capsule Take 1,000 mg by mouth 2 (two) times daily.  01/03/19  [provider]    Family History Family History  Problem Relation Age of Onset  . Diabetes Mother   . Hypertension Mother   . Cancer Maternal Grandmother        breast  . Breast cancer Maternal Grandmother   . Breast cancer Sister        49's  . Bipolar disorder Sister   . Breast cancer Maternal Aunt   . Breast cancer Cousin        mat cousin    Social History Social History   Tobacco Use  . Smoking status: Former Smoker    Packs/day: 1.00    Types: Cigarettes    Quit date: 04/21/2011    Years since quitting: 7.7  . Smokeless tobacco: Never Used  Substance Use Topics  . Alcohol use: Yes    Alcohol/week: 0.0 standard drinks    Comment: rare beer  . Drug use: No     Allergies   Vicodin [hydrocodone-acetaminophen]   Review of Systems Review of Systems  Constitutional: Negative.   Musculoskeletal:       Left 4th toe injury, pain.   Physical Exam Triage Vital Signs ED Triage Vitals  Enc Vitals Group     BP 01/03/19 1343 (!) 160/101     Pulse Rate 01/03/19 1343 78     Resp 01/03/19 1343 18     Temp 01/03/19 1343 98.4 F (36.9 C)     Temp Source 01/03/19 1343 Oral     SpO2 01/03/19 1343 98 %     Weight 01/03/19 1343 205 lb (93 kg)     Height 01/03/19 1343 5\' 5"  (1.651 m)     Head Circumference --      Peak Flow --      Pain Score 01/03/19 1340 10     Pain Loc --      Pain Edu? --      Excl. in [place]? --    Updated Vital Signs BP (!) 160/101 (  BP Location: Left Arm)   Pulse 78   Temp 98.4 F (36.9 C) (Oral)   Resp 18   Ht 5\' 5"   (1.651 m)   Wt 93 kg   SpO2 98%   BMI 34.11 kg/m   Visual Acuity Right Eye Distance:   Left Eye Distance:   Bilateral Distance:    Right Eye Near:   Left Eye Near:    Bilateral Near:     Physical Exam Vitals signs and nursing note reviewed.  Constitutional:      General: She is not in acute distress.    Appearance: Normal appearance. She is not ill-appearing.  HENT:     Head: Normocephalic and atraumatic.  Eyes:     General:        Right eye: No discharge.        Left eye: No discharge.     Conjunctiva/sclera: Conjunctivae normal.  Pulmonary:     Effort: Pulmonary effort is normal. No respiratory distress.  Musculoskeletal:     Comments: Left fourth toe with exquisite tenderness to palpation at the MTP joint and distally.  Skin:    General: Skin is warm.     Findings: No bruising.  Neurological:     Mental Status: She is alert.  Psychiatric:        Mood and Affect: Mood normal.        Behavior: Behavior normal.    UC Treatments / Results  Labs (all labs ordered are listed, but only abnormal results are displayed) Labs Reviewed - No data to display  EKG   Radiology Dg Toe 4th Left  Result Date: 01/03/2019 CLINICAL DATA:  Patient suffered a blow to the left fourth toe on a chair last night. Pain. Initial encounter. EXAM: LEFT FOURTH TOE COMPARISON:  None. FINDINGS: There is no evidence of fracture or dislocation. There is no evidence of arthropathy or other focal bone abnormality. Soft tissues are unremarkable. IMPRESSION: Negative exam. Electronically Signed   By: Inge Rise M.D.   On: 01/03/2019 14:07    Procedures Procedures (including critical care time)  Medications Ordered in UC Medications - No data to display  Initial Impression / Assessment and Plan / UC Course  I have reviewed the triage vital signs and the nursing notes.  Pertinent labs & imaging results that were available during my care of the patient were reviewed by me and considered  in my medical decision making (see chart for details).    68 year old female presents with a toe injury.  X-ray negative.  Naproxen as needed.  Supportive care.  Final Clinical Impressions(s) / UC Diagnoses   Final diagnoses:  Pain of toe of left foot     Discharge Instructions     Rest, Ice, elevation.  Medication as needed.  Take care  Dr. Lacinda Axon    ED Prescriptions    Medication Sig Dispense Auth. Provider   naproxen (NAPROSYN) 500 MG tablet Take 1 tablet (500 mg total) by mouth 2 (two) times daily as needed for moderate pain. 10 tablet Coral Spikes, DO     Controlled Substance Prescriptions [place] Controlled Substance Registry consulted? Not Applicable   Coral Spikes, DO 01/03/19 1449

## 2019-01-17 ENCOUNTER — Other Ambulatory Visit: Payer: Self-pay

## 2019-01-17 ENCOUNTER — Encounter: Payer: Self-pay | Admitting: Emergency Medicine

## 2019-01-17 ENCOUNTER — Ambulatory Visit
Admission: EM | Admit: 2019-01-17 | Discharge: 2019-01-17 | Disposition: A | Discharge status: Home / Self Care | Payer: Medicare Other | Attending: Urgent Care | Admitting: Urgent Care

## 2019-01-17 DIAGNOSIS — J019 Acute sinusitis, unspecified: Secondary | ICD-10-CM

## 2019-01-17 DIAGNOSIS — B9689 Other specified bacterial agents as the cause of diseases classified elsewhere: Secondary | ICD-10-CM

## 2019-01-17 MED ORDER — FLUCONAZOLE 150 MG PO TABS: ORAL_TABLET | ORAL | 0 refills | Status: DC

## 2019-01-17 MED ORDER — AMOXICILLIN-POT CLAVULANATE 875-125 MG PO TABS: 1.0000 | ORAL_TABLET | Freq: Two times a day (BID) | ORAL | 0 refills | Status: AC

## 2019-01-17 NOTE — ED Provider Notes (Signed)
[name], [name]   Name: Brenda Lin DOB: Jun 09, 1950 MRN: [id number] CSN: [id number] PCP: Cristy Friedlander, PA  Arrival date and time:  01/17/19 1459  Chief Complaint:  Facial Pain and Nasal Congestion   NOTE: Prior to seeing the patient today, I have reviewed the triage nursing documentation and vital signs. Clinical staff has updated patient's PMH/PSHx, current medication list, and drug allergies/intolerances to ensure comprehensive history available to assist in medical decision making.   History:   HPI: Brenda Lin is a 68 y.o. female who presents today with complaints of a 4-day history of worsening upper respiratory symptoms.  Patient complains of a frontal headache, vertiginous symptoms, paranasal sinus tenderness, and pain in her RIGHT ear.  Patient denies any cough or sore throat.  She denies any associated fevers, however her temperatures have been running in the range of 99.  She has tried several interventions including loratadine, diphenhydramine, montelukast, Mucinex, and fluticasone.  She notes that despite the conservative management using the aforementioned interventions, her symptoms have continued to worsen.  Patient denies any nausea, vomiting, or diarrhea.  She is eating and drinking well.  She denies any known sick contacts.  Patient was tested for SARS-CoV-2 (novel coronavirus) back in July, and does not wish to be retested today.  Past Medical History:  Diagnosis Date   Acid reflux    Anxiety    Arthritis    Asthma    Bronchitis    Depression    Diabetes mellitus without complication ([place])    Diverticulitis 2013   Hypertension    Rhinitis     Past Surgical History:  Procedure Laterality Date   APPENDECTOMY     BREAST BIOPSY Left 02/2018   u/s bx by Dr Bary Castilla- neg   BREAST DUCTAL SYSTEM EXCISION Left 06/27/2018   Procedure: EXCISION LEFT BREAST DUCTAL STRUCTURE, DIABETIC;  Surgeon: Robert Bellow, MD;  Location: [facility] ORS;   Service: General;  Laterality: Left;   COLON RESECTION  2013   Dr Marina Gravel   COLONOSCOPY  2015   Dr Ernst Breach   COLONOSCOPY WITH PROPOFOL N/A 11/22/2018   Procedure: COLONOSCOPY WITH PROPOFOL;  Surgeon: Lollie Sails, MD;  Location[facility] ENDOSCOPY;  Service: Endoscopy;  Laterality: N/A;   diverticulosis     HERNIA REPAIR  0000000   umbilical   SHOULDER ARTHROSCOPY W/ ROTATOR CUFF REPAIR Left 2016    Family History  Problem Relation Age of Onset   Diabetes Mother    Hypertension Mother    Cancer Maternal Grandmother        breast   Breast cancer Maternal Grandmother    Breast cancer Sister        64's   Bipolar disorder Sister    Breast cancer Maternal Aunt    Breast cancer Cousin        mat cousin    Social History   Tobacco Use   Smoking status: Former Smoker    Packs/day: 1.00    Types: Cigarettes    Quit date: 04/21/2011    Years since quitting: 7.7   Smokeless tobacco: Never Used  Substance Use Topics   Alcohol use: Yes    Alcohol/week: 0.0 standard drinks    Comment: rare beer   Drug use: No    Patient Active Problem List   Diagnosis Date Noted   Lesion of nipple 03/24/2018   Encounter for screening mammogram for breast cancer 03/07/2018    Home Medications:    Current Meds  Medication Sig   ACCU-CHEK AVIVA PLUS test strip    albuterol (PROVENTIL HFA;VENTOLIN HFA) 108 (90 Base) MCG/ACT inhaler Inhale 2 puffs into the lungs every 4 (four) hours as needed for wheezing or shortness of breath.   aspirin EC 81 MG tablet Take 81 mg by mouth daily.   atorvastatin (LIPITOR) 20 MG tablet    buPROPion (WELLBUTRIN XL) 150 MG 24 hr tablet as needed.    busPIRone (BUSPAR) 5 MG tablet Take 5 mg by mouth daily.   fluticasone (FLONASE) 50 MCG/ACT nasal spray as needed.    fluticasone (FLOVENT HFA) 110 MCG/ACT inhaler Inhale 2 puffs into the lungs 2 (two) times daily.   loratadine (CLARITIN) 10 MG tablet Take 10 mg by mouth daily.   losartan  (COZAAR) 50 MG tablet Take 50 mg by mouth daily.    metFORMIN (GLUCOPHAGE-XR) 500 MG 24 hr tablet Take 500 mg by mouth daily with breakfast.    montelukast (SINGULAIR) 10 MG tablet Take 10 mg by mouth at bedtime.   naproxen (NAPROSYN) 500 MG tablet Take 1 tablet (500 mg total) by mouth 2 (two) times daily as needed for moderate pain.   Omega-3 Fatty Acids (FISH OIL) 500 MG CAPS Take by mouth.    Allergies:   Vicodin [hydrocodone-acetaminophen]  Review of Systems (ROS): Review of Systems  Constitutional: Positive for fever (99 range). Negative for chills and fatigue.  HENT: Positive for congestion, ear pain, postnasal drip, sinus pressure and sinus pain. Negative for ear discharge, rhinorrhea, sneezing, sore throat and trouble swallowing.   Eyes: Negative for pain, discharge and redness.  Respiratory: Negative for cough, chest tightness and shortness of breath.   Cardiovascular: Negative for chest pain and palpitations.  Gastrointestinal: Negative for abdominal pain, diarrhea, nausea and vomiting.  Genitourinary:       Voiding per baseline habits  Musculoskeletal: Negative for arthralgias, back pain, myalgias and neck pain.  Skin: Negative for color change, pallor and rash.  Neurological: Positive for dizziness and headaches. Negative for syncope and weakness.  Hematological: Negative for adenopathy.     Vital Signs: Today's Vitals   01/17/19 1515 01/17/19 1518 01/17/19 1605  BP:  (!) 138/100   Pulse:  89   Resp:  18   Temp:  98.4 F (36.9 C)   TempSrc:  Oral   SpO2:  98%   Weight: 200 lb (90.7 kg)    Height: 5\' 5"  (1.651 m)    PainSc: 8   8     Physical Exam: Physical Exam  Constitutional: She is oriented to person, place, and time and well-developed, well-nourished, and in no distress. She appears not dehydrated.  Non-toxic appearance. She has a sickly appearance (Acutely ill-appearing). No distress.  HENT:  Head: Normocephalic and atraumatic.  Right Ear: There is  tenderness. No drainage. Tympanic membrane is erythematous and bulging.  Left Ear: No drainage or tenderness. Tympanic membrane is not injected, not erythematous and not bulging. A middle ear effusion (Mild serous) is present.  Nose: Mucosal edema, rhinorrhea and sinus tenderness present.  Mouth/Throat: Uvula is midline and mucous membranes are normal. Posterior oropharyngeal erythema (+) clear PND present.  Eyes: Pupils are equal, round, and reactive to light. Conjunctivae and EOM are normal.  Neck: Normal range of motion. Neck supple. No tracheal deviation present.  Cardiovascular: Normal rate, regular rhythm, normal heart sounds and intact distal pulses. Exam reveals no gallop and no friction rub.  No murmur heard. Pulmonary/Chest: Effort normal and breath sounds normal. No respiratory  distress. She has no wheezes. She has no rales.  Abdominal: Soft. Normal appearance and bowel sounds are normal. She exhibits no distension. There is no abdominal tenderness.  Musculoskeletal: Normal range of motion.  Lymphadenopathy:       Head (right side): Submandibular and preauricular adenopathy present.    She has no cervical adenopathy.  Neurological: She is alert and oriented to person, place, and time. She has normal sensation, normal strength and intact cranial nerves. Gait normal. GCS score is 15.  Skin: Skin is warm and dry. No rash noted. She is not diaphoretic.  Psychiatric: Mood, memory, affect and judgment normal.  Nursing note and vitals reviewed.   Urgent Care Treatments / Results:   LABS: PLEASE NOTE: all labs that were ordered this encounter are listed, however only abnormal results are displayed. Labs Reviewed - No data to display  EKG: -None  RADIOLOGY: No results found.  PROCEDURES: Procedures  MEDICATIONS RECEIVED THIS VISIT: Medications - No data to display  PERTINENT CLINICAL COURSE NOTES/UPDATES:   Initial Impression / Assessment and Plan / Urgent Care Course:    Pertinent labs & imaging results that were available during my care of the patient were personally reviewed by me and considered in my medical decision making (see lab/imaging section of note for values and interpretations).  Brenda Lin is a 68 y.o. female who presents to [facility] today with complaints of Facial Pain and Nasal Congestion   Patient is well appearing overall in clinic today. She does not appear to be in any acute distress. Presenting symptoms (see HPI) and exam as documented above.  Symptoms progressively worsening over the course of the last 4 days.  Suspect acute bacterial rhinosinusitis.  Patient has failed conservative management using allergy medications, mucolytics, nasal sprays, and increased hydration.  Patient notes that her frontal headache is not really responding to APAP or IBU. RIGHT ear is clearly infected.  Patient does not appear toxic, however she visibly does not feel well.  She is previously been tested for SARS-CoV-2 (novel coronavirus) and wishes to defer further testing today despite encouragement from staff to be retested.  Will proceed with antimicrobial therapy using a 7-day course of Augmentin. Discussed supportive care measures at home during acute phase of illness. Patient to rest as much as possible. She was encouraged to ensure adequate hydration (water and ORS) to prevent dehydration and electrolyte derangements. Patient may use APAP and/or IBU on an as needed basis for pain/fever.  Patient advising that she is prone to developing vulvovaginal candidiasis when on antibiotics.  Prescription sent for prophylactic fluconazole per patient's request.  Discussed follow up with primary care physician in 1 week for re-evaluation. I have reviewed the follow up and strict return precautions for any new or worsening symptoms. Patient is aware of symptoms that would be deemed urgent/emergent, and would thus require further evaluation either here or in the  emergency department. At the time of discharge, she verbalized understanding and consent with the discharge plan as it was reviewed with her. All questions were fielded by provider and/or clinic staff prior to patient discharge.    Final Clinical Impressions / Urgent Care Diagnoses:   Final diagnoses:  Acute bacterial rhinosinusitis    New Prescriptions:  [place] Controlled Substance Registry consulted? Not Applicable  Meds ordered this encounter  Medications   amoxicillin-clavulanate (AUGMENTIN) 875-125 MG tablet    Sig: Take 1 tablet by mouth 2 (two) times daily for 7 days.    Dispense:  14 tablet    Refill:  0   fluconazole (DIFLUCAN) 150 MG tablet    Sig: Take 1 tablet (150 mg) PO x 1 dose. May repeat 150 mg dose in 3 days if still symptomatic.    Dispense:  2 tablet    Refill:  0    Recommended Follow up Care:  Patient encouraged to follow up with the following provider within the specified time frame, or sooner as dictated by the severity of her symptoms. As always, she was instructed that for any urgent/emergent care needs, she should seek care either here or in the emergency department for more immediate evaluation.  Follow-up Information    Cristy Friedlander, PA In 1 week.   Specialty: Physician Assistant Contact information: [address] [place] [address] [phone]         NOTE: This note was prepared using Dragon dictation software along with smaller phrase technology. Despite my best ability to proofread, there is the potential that transcriptional errors may still occur from this process, and are completely unintentional.    Karen Kitchens, NP 01/19/19 [phone]

## 2019-01-17 NOTE — Discharge Instructions (Signed)
It was very nice seeing you today in clinic. Thank you for entrusting me with your care.   Rest and increase fluid intake. May use Tylenol and or Ibuprofen as needed for pain and fever. Antibiotic has been called in to your pharmacy.  Make arrangements to follow up with your regular doctor in 1 week for re-evaluation if not improving. If your symptoms/condition worsens, please seek follow up care either here or in the ER. Please remember, our [place] providers are "right here with you" when you need [place].   Again, it was my pleasure to take care of you today. Thank you for choosing our clinic. I hope that you start to feel better quickly.   Honor Loh, MSN, APRN, FNP-C, CEN Advanced Practice Provider [facility]

## 2019-01-17 NOTE — ED Triage Notes (Signed)
Patient c/o sinus pain and drainage x 3 -4 days. She also reports right ear pain. She has taken OTC Benadryl for her symptoms.

## 2019-02-07 ENCOUNTER — Ambulatory Visit
Admission: EM | Admit: 2019-02-07 | Discharge: 2019-02-07 | Disposition: A | Discharge status: Home / Self Care | Payer: Medicare Other | Attending: Family Medicine | Admitting: Family Medicine

## 2019-02-07 ENCOUNTER — Other Ambulatory Visit: Payer: Self-pay

## 2019-02-07 DIAGNOSIS — H6692 Otitis media, unspecified, left ear: Secondary | ICD-10-CM

## 2019-02-07 MED ORDER — NAPROXEN 500 MG PO TABS: 500.0000 mg | ORAL_TABLET | Freq: Two times a day (BID) | ORAL | 0 refills | Status: DC | PRN

## 2019-02-07 MED ORDER — CEFDINIR 300 MG PO CAPS: 300.0000 mg | ORAL_CAPSULE | Freq: Two times a day (BID) | ORAL | 0 refills | Status: DC

## 2019-02-07 NOTE — ED Provider Notes (Signed)
[facility] ____________________________________________  Time seen: Approximately 2:06 PM  I have reviewed the triage vital signs and the nursing notes.   HISTORY  Chief Complaint Otalgia (left)  HPI Brenda Lin is a 68 y.o. female presenting for evaluation of left ear pain.  Reports she has had intermittent left ear pain for the last 2 weeks but has been more constant and present for the last few days.  Has been having some sinus issues with her allergies recently.  Approximately 3 weeks ago was treated with Augmentin for sinusitis and right ear discomfort, which she reports has fully resolved.  Occasional postnasal drainage with cough.  Denies accompanying fevers.  Denies injury or trauma.  No hearing changes.  Denies drainage.  Reports otherwise doing well.  Cristy Friedlander, PA: PCP   Past Medical History:  Diagnosis Date  . Acid reflux   . Anxiety   . Arthritis   . Asthma   . Bronchitis   . Depression   . Diabetes mellitus without complication ([place])   . Diverticulitis 2013  . Hypertension   . Rhinitis     Patient Active Problem List   Diagnosis Date Noted  . Lesion of nipple 03/24/2018  . Encounter for screening mammogram for breast cancer 03/07/2018    Past Surgical History:  Procedure Laterality Date  . APPENDECTOMY    . BREAST BIOPSY Left 02/2018   u/s bx by Dr Bary Castilla- neg  . BREAST DUCTAL SYSTEM EXCISION Left 06/27/2018   Procedure: EXCISION LEFT BREAST DUCTAL STRUCTURE, DIABETIC;  Surgeon: Robert Bellow, MD;  Location: [facility] ORS;  Service: General;  Laterality: Left;  . COLON RESECTION  2013   Dr Marina Gravel  . COLONOSCOPY  2015   Dr Ernst Breach  . COLONOSCOPY WITH PROPOFOL N/A 11/22/2018   Procedure: COLONOSCOPY WITH PROPOFOL;  Surgeon: Lollie Sails, MD;  Location: [facility] ENDOSCOPY;  Service: Endoscopy;  Laterality: N/A;  . diverticulosis    . HERNIA REPAIR  0000000   umbilical  . SHOULDER ARTHROSCOPY W/ ROTATOR CUFF REPAIR Left 2016     No current facility-administered medications for this encounter.   Current Outpatient Medications:  .  ACCU-CHEK AVIVA PLUS test strip, , Disp: , Rfl:  .  albuterol (PROVENTIL HFA;VENTOLIN HFA) 108 (90 Base) MCG/ACT inhaler, Inhale 2 puffs into the lungs every 4 (four) hours as needed for wheezing or shortness of breath., Disp: 1 Inhaler, Rfl: 0 .  aspirin EC 81 MG tablet, Take 81 mg by mouth daily., Disp: , Rfl:  .  atorvastatin (LIPITOR) 20 MG tablet, , Disp: , Rfl: 0 .  buPROPion (WELLBUTRIN XL) 150 MG 24 hr tablet, as needed. , Disp: , Rfl:  .  busPIRone (BUSPAR) 5 MG tablet, Take 5 mg by mouth daily., Disp: , Rfl:  .  fluticasone (FLONASE) 50 MCG/ACT nasal spray, as needed. , Disp: , Rfl:  .  fluticasone (FLOVENT HFA) 110 MCG/ACT inhaler, Inhale 2 puffs into the lungs 2 (two) times daily., Disp: , Rfl:  .  loratadine (CLARITIN) 10 MG tablet, Take 10 mg by mouth daily., Disp: , Rfl:  .  losartan (COZAAR) 50 MG tablet, Take 50 mg by mouth daily. , Disp: , Rfl:  .  metFORMIN (GLUCOPHAGE-XR) 500 MG 24 hr tablet, Take 500 mg by mouth daily with breakfast. , Disp: , Rfl: 0 .  montelukast (SINGULAIR) 10 MG tablet, Take 10 mg by mouth at bedtime., Disp: , Rfl:  .  Omega-3 Fatty Acids (FISH OIL) 500 MG  CAPS, Take by mouth., Disp: , Rfl:  .  cefdinir (OMNICEF) 300 MG capsule, Take 1 capsule (300 mg total) by mouth 2 (two) times daily., Disp: 20 capsule, Rfl: 0 .  naproxen (NAPROSYN) 500 MG tablet, Take 1 tablet (500 mg total) by mouth 2 (two) times daily as needed for moderate pain., Disp: 20 tablet, Rfl: 0  Allergies Vicodin [hydrocodone-acetaminophen]  Family History  Problem Relation Age of Onset  . Diabetes Mother   . Hypertension Mother   . Cancer Maternal Grandmother        breast  . Breast cancer Maternal Grandmother   . Breast cancer Sister        28's  . Bipolar disorder Sister   . Breast cancer Maternal Aunt   . Breast cancer Cousin        mat cousin    Social History  Social History   Tobacco Use  . Smoking status: Former Smoker    Packs/day: 1.00    Types: Cigarettes    Quit date: 04/21/2011    Years since quitting: 7.8  . Smokeless tobacco: Never Used  Substance Use Topics  . Alcohol use: Yes    Alcohol/week: 0.0 standard drinks    Comment: rare beer  . Drug use: No    Review of Systems Constitutional: No fever ENT: No sore throat. Positive ear pain.  Cardiovascular: Denies chest pain. Respiratory: Denies shortness of breath. Gastrointestinal: No abdominal pain.   Musculoskeletal: Negative for back pain. Skin: Negative for rash.   ____________________________________________   PHYSICAL EXAM:  VITAL SIGNS: ED Triage Vitals  Enc Vitals Group     BP 02/07/19 1335 (!) 152/88     Pulse Rate 02/07/19 1335 90     Resp 02/07/19 1335 18     Temp 02/07/19 1335 98 F (36.7 C)     Temp Source 02/07/19 1335 Oral     SpO2 02/07/19 1335 99 %     Weight 02/07/19 1333 190 lb (86.2 kg)     Height 02/07/19 1333 5\' 5"  (1.651 m)     Head Circumference --      Peak Flow --      Pain Score 02/07/19 1333 9     Pain Loc --      Pain Edu? --      Excl. in [place]? --    Constitutional: Alert and oriented. Well appearing and in no acute distress. Eyes: Conjunctivae are normal.  Head: Atraumatic. No sinus tenderness to palpation. No swelling. No erythema.  Ears:   Left: Nontender to auricle movement, normal canal, moderate erythema and dull TM.  Right: Nontender, normal canal, no erythema, normal TM.  Nose:No nasal congestion Hematological/Lymphatic/Immunilogical: No cervical lymphadenopathy. Cardiovascular: Normal rate, regular rhythm. Grossly normal heart sounds.  Good peripheral circulation. Respiratory: Normal respiratory effort.  No retractions. No wheezes, rales or rhonchi. Good air movement.  Musculoskeletal: Ambulatory with steady gait.  Neurologic:  Normal speech and language. No gait instability. Skin:  Skin appears warm, dry and intact. No  rash noted. Psychiatric: Mood and affect are normal. Speech and behavior are normal. ___________________________________________   LABS (all labs ordered are listed, but only abnormal results are displayed)  Labs Reviewed - No data to display ____________________________________________  PROCEDURES Procedures    INITIAL IMPRESSION / ASSESSMENT AND PLAN / ED COURSE  Pertinent labs & imaging results that were available during my care of the patient were reviewed by me and considered in my medical decision making (see chart for  details).  Well-appearing patient.  No acute distress.  Chronic seasonal allergies.  Left otitis media.  Will treat with oral cefdinir.  Encourage rest, fluids, supportive care, continue allergy medication.Discussed indication, risks and benefits of medications with patient.  Discussed follow up with Primary care physician this week. Discussed follow up and return parameters including no resolution or any worsening concerns. Patient verbalized understanding and agreed to plan.   ____________________________________________   FINAL CLINICAL IMPRESSION(S) / ED DIAGNOSES  Final diagnoses:  Left otitis media, unspecified otitis media type     ED Discharge Orders         Ordered    cefdinir (OMNICEF) 300 MG capsule  2 times daily     02/07/19 1355    naproxen (NAPROSYN) 500 MG tablet  2 times daily PRN     02/07/19 1355           Note: This dictation was prepared with Dragon dictation along with smaller phrase technology. Any transcriptional errors that result from this process are unintentional.         Marylene Land, NP 02/07/19 1528

## 2019-02-07 NOTE — Discharge Instructions (Addendum)
Take medication as prescribed. Rest. Drink plenty of fluids. Continue your allergy medication.   Follow up with your primary care physician this week as needed. Return to Urgent care for new or worsening concerns.

## 2019-02-07 NOTE — ED Triage Notes (Signed)
Patient complains of left ear pain that started 2 weeks ago but has worsened over last few days. Patient states that pain has been constant and worse at night.

## 2019-04-03 ENCOUNTER — Other Ambulatory Visit: Payer: Self-pay | Admitting: Physician Assistant

## 2019-04-03 DIAGNOSIS — Z1231 Encounter for screening mammogram for malignant neoplasm of breast: Secondary | ICD-10-CM

## 2019-05-16 ENCOUNTER — Other Ambulatory Visit: Payer: Self-pay

## 2019-05-16 ENCOUNTER — Ambulatory Visit
Admission: RE | Admit: 2019-05-16 | Discharge: 2019-05-16 | Disposition: A | Discharge status: Home / Self Care | Payer: Medicare Other | Source: Ambulatory Visit | Attending: Physician Assistant | Admitting: Physician Assistant

## 2019-05-16 DIAGNOSIS — Z1231 Encounter for screening mammogram for malignant neoplasm of breast: Secondary | ICD-10-CM

## 2019-10-20 ENCOUNTER — Other Ambulatory Visit: Payer: Self-pay

## 2019-10-20 ENCOUNTER — Encounter: Payer: Self-pay | Admitting: Emergency Medicine

## 2019-10-20 ENCOUNTER — Ambulatory Visit
Admission: EM | Admit: 2019-10-20 | Discharge: 2019-10-20 | Disposition: A | Discharge status: Home / Self Care | Payer: Medicare Other | Attending: Family Medicine | Admitting: Family Medicine

## 2019-10-20 DIAGNOSIS — J0110 Acute frontal sinusitis, unspecified: Secondary | ICD-10-CM

## 2019-10-20 DIAGNOSIS — R197 Diarrhea, unspecified: Secondary | ICD-10-CM

## 2019-10-20 MED ORDER — AZITHROMYCIN 250 MG PO TABS: 250.0000 mg | ORAL_TABLET | Freq: Every day | ORAL | 0 refills | Status: DC

## 2019-10-20 NOTE — ED Triage Notes (Signed)
Patient in today c/o abdominal pain and diarrhea x 3 days. Patient states the back of her eyes hurt and light headed. Patient has tried OTC similar to [employer] and [employer].

## 2019-10-20 NOTE — ED Provider Notes (Signed)
[place]   [id number] 10/20/19 Arrival Time: 7106  YI:RSWN THROAT  SUBJECTIVE: History from: patient.  Brenda Lin is a 69 y.o. female who presents with abrupt onset of nasal congestion, headache, fatigue, chills for the last 4 days.  Reports that she has been having nasal congestion and cough from postnasal drip for about a week before that.  Denies known exposures to Covid.  Denies having Covid.  Reports that she has had 2 Covid vaccines and completed her series.  She has been taking Alka-Seltzer cold with little relief.  There are no aggravating symptoms. Denies previous symptoms in the past.     Denies fever, ear pain,  rhinorrhea,  cough, SOB, wheezing, chest pain, nausea, rash, changes in bowel or bladder habits.     ROS: As per HPI.  All other pertinent ROS negative.     Past Medical History:  Diagnosis Date  . Acid reflux   . Anxiety   . Arthritis   . Asthma   . Bronchitis   . Depression   . Diabetes mellitus without complication ([place])   . Diverticulitis 2013  . Hypertension   . Rhinitis    Past Surgical History:  Procedure Laterality Date  . APPENDECTOMY    . BREAST BIOPSY Left 02/2018   u/s bx by Dr Bary Castilla- neg  . BREAST DUCTAL SYSTEM EXCISION Left 06/27/2018   Procedure: EXCISION LEFT BREAST DUCTAL STRUCTURE, DIABETIC;  Surgeon: Robert Bellow, MD;  Location: [facility] ORS;  Service: General;  Laterality: Left;  . COLON RESECTION  2013   Dr Marina Gravel  . COLONOSCOPY  2015   Dr Ernst Breach  . COLONOSCOPY WITH PROPOFOL N/A 11/22/2018   Procedure: COLONOSCOPY WITH PROPOFOL;  Surgeon: Lollie Sails, MD;  Location: [facility]COPY;  Service: Endoscopy;  Laterality: N/A;  . diverticulosis    . HERNIA REPAIR  4627   umbilical  . SHOULDER ARTHROSCOPY W/ ROTATOR CUFF REPAIR Left 2016   Allergies  Allergen Reactions  . Vicodin [Hydrocodone-Acetaminophen] Itching   No current facility-administered medications on file prior to encounter.   Current  Outpatient Medications on File Prior to Encounter  Medication Sig Dispense Refill  . albuterol (PROVENTIL HFA;VENTOLIN HFA) 108 (90 Base) MCG/ACT inhaler Inhale 2 puffs into the lungs every 4 (four) hours as needed for wheezing or shortness of breath. 1 Inhaler 0  . aspirin EC 81 MG tablet Take 81 mg by mouth daily.    Marland Kitchen atorvastatin (LIPITOR) 20 MG tablet   0  . buPROPion (WELLBUTRIN XL) 150 MG 24 hr tablet as needed.     . busPIRone (BUSPAR) 5 MG tablet Take 5 mg by mouth daily.    . fluticasone (FLONASE) 50 MCG/ACT nasal spray as needed.     . fluticasone (FLOVENT HFA) 110 MCG/ACT inhaler Inhale 2 puffs into the lungs 2 (two) times daily.    Marland Kitchen loratadine (CLARITIN) 10 MG tablet Take 10 mg by mouth daily.    Marland Kitchen losartan (COZAAR) 50 MG tablet Take 50 mg by mouth daily.     . metFORMIN (GLUCOPHAGE-XR) 500 MG 24 hr tablet Take 500 mg by mouth daily with breakfast.   0  . montelukast (SINGULAIR) 10 MG tablet Take 10 mg by mouth at bedtime.    . Omega-3 Fatty Acids (FISH OIL) 500 MG CAPS Take by mouth.    Marland Kitchen ACCU-CHEK AVIVA PLUS test strip     . [DISCONTINUED] allopurinol (ZYLOPRIM) 100 MG tablet Take 100 mg by mouth daily.    . [  DISCONTINUED] colchicine 0.6 MG tablet Take 2 tablets (1.2 mg) initially. Repeat with 1 tablet( 0.6 mg) in one hour. Do not take anymore on the first day. On second day start taking 1 daily until gout attack has resolved. 15 tablet 0  . [DISCONTINUED] DULoxetine (CYMBALTA) 20 MG capsule duloxetine 20 mg capsule,delayed release    . [DISCONTINUED] omega-3 acid ethyl esters (LOVAZA) 1 g capsule Take 1,000 mg by mouth 2 (two) times daily.     Social History   Socioeconomic History  . Marital status: Single    Spouse name: Not on file  . Number of children: Not on file  . Years of education: Not on file  . Highest education level: Not on file  Occupational History    Comment: disabled  Tobacco Use  . Smoking status: Former Smoker    Packs/day: 1.00    Types:  Cigarettes    Quit date: 04/21/2011    Years since quitting: 8.5  . Smokeless tobacco: Never Used  Vaping Use  . Vaping Use: Never used  Substance and Sexual Activity  . Alcohol use: Yes    Alcohol/week: 0.0 standard drinks    Comment: rare beer  . Drug use: No  . Sexual activity: Not on file  Other Topics Concern  . Not on file  Social History Narrative  . Not on file   Social Determinants of Health   Financial Resource Strain:   . Difficulty of Paying Living Expenses:   Food Insecurity:   . Worried About Charity fundraiser in the Last Year:   . Arboriculturist in the Last Year:   Transportation Needs:   . Film/video editor (Medical):   Marland Kitchen Lack of Transportation (Non-Medical):   Physical Activity:   . Days of Exercise per Week:   . Minutes of Exercise per Session:   Stress:   . Feeling of Stress :   Social Connections:   . Frequency of Communication with Friends and Family:   . Frequency of Social Gatherings with Friends and Family:   . Attends Religious Services:   . Active Member of Clubs or Organizations:   . Attends Archivist Meetings:   Marland Kitchen Marital Status:   Intimate Partner Violence:   . Fear of Current or Ex-Partner:   . Emotionally Abused:   Marland Kitchen Physically Abused:   . Sexually Abused:    Family History  Problem Relation Age of Onset  . Diabetes Mother   . Hypertension Mother   . Other Father        unknown medical history  . Cancer Maternal Grandmother        breast  . Breast cancer Maternal Grandmother   . Breast cancer Sister        53's  . Bipolar disorder Sister   . Breast cancer Maternal Aunt   . Breast cancer Cousin        mat cousin    OBJECTIVE:  Vitals:   10/20/19 1533  BP: 135/86  Pulse: (!) 103  Resp: 18  Temp: 98.2 F (36.8 C)  TempSrc: Oral  SpO2: 98%  Weight: 200 lb (90.7 kg)  Height: 5\' 4"  (1.626 m)     General appearance: alert; appears fatigued, but nontoxic, speaking in full sentences and managing own  secretions HEENT: NCAT; Ears: EACs clear, TMs pearly gray with visible cone of light, without erythema; maxillary sinus tenderness present bilaterally eyes: PERRL, EOMI grossly; Nose: no obvious rhinorrhea; Throat: oropharynx clear,  tonsils 1+ and mildly erythematous without white tonsillar exudates, uvula midline Neck: supple without LAD Lungs: CTA bilaterally without adventitious breath sounds; cough absent Heart: regular rate and rhythm.  Radial pulses 2+ symmetrical bilaterally Skin: warm and dry Psychological: alert and cooperative; normal mood and affect  LABS: No results found for this or any previous visit (from the past 24 hour(s)).   ASSESSMENT & PLAN:  1. Acute non-recurrent frontal sinusitis   2. Diarrhea, unspecified type     Meds ordered this encounter  Medications  . azithromycin (ZITHROMAX) 250 MG tablet    Sig: Take 1 tablet (250 mg total) by mouth daily. Take first 2 tablets together, then 1 every day until finished.    Dispense:  6 tablet    Refill:  0    Order Specific Question:   Supervising Provider    Answer:   Chase Picket [id number]    Acute Sinusitis Push fluids and get rest Prescribed azithromycin.   May take Imodium as needed for diarrhea Take as directed and to completion.  Drink warm or cool liquids, use throat lozenges, or popsicles to help alleviate symptoms Take OTC ibuprofen or tylenol as needed for pain Follow up with PCP if symptoms persist Return or go to ER if you have any new or worsening symptoms such as fever, chills, nausea, vomiting, worsening sore throat, cough, abdominal pain, chest pain, changes in bowel or bladder habits.   Reviewed expectations re: course of current medical issues. Questions answered. Outlined signs and symptoms indicating need for more acute intervention. Patient verbalized understanding. After Visit Summary given.           Faustino Congress, NP 10/20/19 [phone]

## 2019-10-20 NOTE — Discharge Instructions (Signed)
You have a sinus infection.  I have prescribed azithromycin. Take 2 tablets today, and one tablet daily for the next 4 days  Follow up with this office or with primary care if you are not feeling better over the next 2 days.  Follow up with the ER for trouble swallowing, trouble breathing, other concerning symptoms.

## 2020-02-26 ENCOUNTER — Other Ambulatory Visit: Payer: Self-pay | Admitting: Physician Assistant

## 2020-02-26 DIAGNOSIS — Z1231 Encounter for screening mammogram for malignant neoplasm of breast: Secondary | ICD-10-CM

## 2020-05-16 ENCOUNTER — Ambulatory Visit
Admission: RE | Admit: 2020-05-16 | Discharge: 2020-05-16 | Disposition: A | Discharge status: Home / Self Care | Payer: Medicare Other | Source: Ambulatory Visit | Attending: Physician Assistant | Admitting: Physician Assistant

## 2020-05-16 ENCOUNTER — Other Ambulatory Visit: Payer: Self-pay

## 2020-05-16 DIAGNOSIS — Z1231 Encounter for screening mammogram for malignant neoplasm of breast: Secondary | ICD-10-CM | POA: Diagnosis not present

## 2020-05-23 ENCOUNTER — Ambulatory Visit
Admission: EM | Admit: 2020-05-23 | Discharge: 2020-05-23 | Disposition: A | Discharge status: Home / Self Care | Payer: Medicare Other | Attending: Family Medicine | Admitting: Family Medicine

## 2020-05-23 ENCOUNTER — Other Ambulatory Visit: Payer: Self-pay

## 2020-05-23 DIAGNOSIS — Z7951 Long term (current) use of inhaled steroids: Secondary | ICD-10-CM | POA: Insufficient documentation

## 2020-05-23 DIAGNOSIS — Z79899 Other long term (current) drug therapy: Secondary | ICD-10-CM | POA: Diagnosis not present

## 2020-05-23 DIAGNOSIS — Z87891 Personal history of nicotine dependence: Secondary | ICD-10-CM | POA: Insufficient documentation

## 2020-05-23 DIAGNOSIS — J069 Acute upper respiratory infection, unspecified: Secondary | ICD-10-CM

## 2020-05-23 DIAGNOSIS — R0981 Nasal congestion: Secondary | ICD-10-CM | POA: Diagnosis not present

## 2020-05-23 DIAGNOSIS — R059 Cough, unspecified: Secondary | ICD-10-CM | POA: Diagnosis not present

## 2020-05-23 DIAGNOSIS — Z7982 Long term (current) use of aspirin: Secondary | ICD-10-CM | POA: Insufficient documentation

## 2020-05-23 DIAGNOSIS — Z7984 Long term (current) use of oral hypoglycemic drugs: Secondary | ICD-10-CM | POA: Diagnosis not present

## 2020-05-23 DIAGNOSIS — U071 COVID-19: Secondary | ICD-10-CM | POA: Diagnosis not present

## 2020-05-23 DIAGNOSIS — Z885 Allergy status to narcotic agent status: Secondary | ICD-10-CM | POA: Insufficient documentation

## 2020-05-23 MED ORDER — BENZONATATE 200 MG PO CAPS: 200.0000 mg | ORAL_CAPSULE | Freq: Three times a day (TID) | ORAL | 0 refills | Status: AC | PRN

## 2020-05-23 MED ORDER — IPRATROPIUM BROMIDE 0.06 % NA SOLN: 2.0000 | Freq: Four times a day (QID) | NASAL | 0 refills | Status: DC | PRN

## 2020-05-23 NOTE — ED Triage Notes (Signed)
Pt c/o productive cough, sneezing, runny nose since about Tuesday. Pt denies f/n/v/d or other symptoms. Pt denies any known sick contacts.

## 2020-05-23 NOTE — Discharge Instructions (Signed)
Medication as prescribed.  Stay home.  Check my chart for COVID test results.  Take care  Dr. Zerline Melchior   

## 2020-05-23 NOTE — ED Provider Notes (Signed)
[facility]    CSN: [id number] Arrival date & time: 05/23/20  1421  History   Chief Complaint Chief Complaint  Patient presents with  . Cough  . Nasal Congestion   HPI   70 year old female presents with the above complaints.  Patient reports that she has had symptoms since Tuesday.  No sick contacts.  She reports cough, congestion.  She is had some runny nose and sneezing.  No fever.  She states that she is feeling improved currently but is still concerned and would like to be tested for COVID-19.  No other reported symptoms.  No other complaints.  Past Medical History:  Diagnosis Date  . Acid reflux   . Anxiety   . Arthritis   . Asthma   . Bronchitis   . Depression   . Diabetes mellitus without complication ([employer])   . Diverticulitis 2013  . Hypertension   . Rhinitis     Patient Active Problem List   Diagnosis Date Noted  . Lesion of nipple 03/24/2018  . Encounter for screening mammogram for breast cancer 03/07/2018    Past Surgical History:  Procedure Laterality Date  . APPENDECTOMY    . BREAST BIOPSY Left 02/2018   u/s bx by Dr Bary Castilla- neg  . BREAST DUCTAL SYSTEM EXCISION Left 06/27/2018   Procedure: EXCISION LEFT BREAST DUCTAL STRUCTURE, DIABETIC;  Surgeon: Robert Bellow, MD;  Location: [facility] ORS;  Service: General;  Laterality: Left;  . COLON RESECTION  2013   Dr Marina Gravel  . COLONOSCOPY  2015   Dr Ernst Breach  . COLONOSCOPY WITH PROPOFOL N/A 11/22/2018   Procedure: COLONOSCOPY WITH PROPOFOL;  Surgeon: Lollie Sails, MD;  Location: [facility] ENDOSCOPY;  Service: Endoscopy;  Laterality: N/A;  . diverticulosis    . HERNIA REPAIR  0000000   umbilical  . SHOULDER ARTHROSCOPY W/ ROTATOR CUFF REPAIR Left 2016    OB History    Gravida  2   Para  2   Term      Preterm      AB      Living  2     SAB      IAB      Ectopic      Multiple      Live Births           Obstetric Comments  1st Menstrual Cycle:  13 1st Pregnancy:  20           Home Medications    Prior to Admission medications   Medication Sig Start Date End Date Taking? Authorizing Provider  ACCU-CHEK AVIVA PLUS test strip  07/11/14  Yes [provider]  albuterol (PROVENTIL HFA;VENTOLIN HFA) 108 (90 Base) MCG/ACT inhaler Inhale 2 puffs into the lungs every 4 (four) hours as needed for wheezing or shortness of breath. [id number]  Yes Defelice, Jeanett Schlein, NP  aspirin EC 81 MG tablet Take 81 mg by mouth daily.   Yes [provider]  atorvastatin (LIPITOR) 20 MG tablet  02/21/18  Yes [provider]  benzonatate (TESSALON) 200 MG capsule Take 1 capsule (200 mg total) by mouth 3 (three) times daily as needed for cough. 05/23/20  Yes Abdulai Blaylock G, DO  buPROPion (WELLBUTRIN XL) 150 MG 24 hr tablet as needed.  06/22/18  Yes [provider]  fluticasone (FLONASE) 50 MCG/ACT nasal spray as needed.  10/01/14  Yes [provider]  fluticasone (FLOVENT HFA) 110 MCG/ACT inhaler Inhale 2 puffs into the lungs 2 (two) times daily.  Yes [provider]  ipratropium (ATROVENT) 0.06 % nasal spray Place 2 sprays into both nostrils 4 (four) times daily as needed for rhinitis. 05/23/20  Yes Maryan Sivak G, DO  loratadine (CLARITIN) 10 MG tablet Take 10 mg by mouth daily.   Yes [provider]  losartan (COZAAR) 50 MG tablet Take 50 mg by mouth daily.    Yes [provider]  metFORMIN (GLUCOPHAGE-XR) 500 MG 24 hr tablet Take 500 mg by mouth daily with breakfast.  09/13/14  Yes [provider]  montelukast (SINGULAIR) 10 MG tablet Take 10 mg by mouth at bedtime.   Yes [provider]  Omega-3 Fatty Acids (FISH OIL) 500 MG CAPS Take by mouth.   Yes [provider]  allopurinol (ZYLOPRIM) 100 MG tablet Take 100 mg by mouth daily.  01/17/19  [provider]  colchicine 0.6 MG tablet Take 2 tablets (1.2 mg) initially. Repeat with 1 tablet( 0.6 mg) in one hour. Do not take anymore on the first  day. On second day start taking 1 daily until gout attack has resolved. 09/08/16 01/03/19  Lorin Picket, PA-C  DULoxetine (CYMBALTA) 20 MG capsule duloxetine 20 mg capsule,delayed release  01/03/19  [provider]  omega-3 acid ethyl esters (LOVAZA) 1 g capsule Take 1,000 mg by mouth 2 (two) times daily.  01/03/19  [provider]    Family History Family History  Problem Relation Age of Onset  . Diabetes Mother   . Hypertension Mother   . Other Father        unknown medical history  . Cancer Maternal Grandmother        breast  . Breast cancer Maternal Grandmother   . Breast cancer Sister        26's  . Bipolar disorder Sister   . Breast cancer Maternal Aunt   . Breast cancer Cousin        mat cousin    Social History Social History   Tobacco Use  . Smoking status: Former Smoker    Packs/day: 1.00    Types: Cigarettes    Quit date: 04/21/2011    Years since quitting: 9.0  . Smokeless tobacco: Never Used  Vaping Use  . Vaping Use: Never used  Substance Use Topics  . Alcohol use: Yes    Alcohol/week: 0.0 standard drinks    Comment: rare beer  . Drug use: No     Allergies   Vicodin [hydrocodone-acetaminophen]   Review of Systems Review of Systems  HENT: Positive for congestion.   Respiratory: Positive for cough.    Physical Exam Triage Vital Signs ED Triage Vitals  Enc Vitals Group     BP 05/23/20 1439 (!) 171/93     Pulse Rate 05/23/20 1439 85     Resp 05/23/20 1439 18     Temp 05/23/20 1439 98.4 F (36.9 C)     Temp Source 05/23/20 1439 Oral     SpO2 05/23/20 1439 96 %     Weight 05/23/20 1436 190 lb (86.2 kg)     Height 05/23/20 1436 5\' 4"  (1.626 m)     Head Circumference --      Peak Flow --      Pain Score 05/23/20 1436 0     Pain Loc --      Pain Edu? --      Excl. in [place]? --    Updated Vital Signs BP (!) 171/93 (BP Location: Left Arm)   Pulse  85   Temp 98.4 F (36.9 C) (Oral)   Resp 18   Ht 5\' 4"  (1.626 m)   Wt 86.2  kg   SpO2 96%   BMI 32.61 kg/m   Visual Acuity Right Eye Distance:   Left Eye Distance:   Bilateral Distance:    Right Eye Near:   Left Eye Near:    Bilateral Near:     Physical Exam Vitals and nursing note reviewed.  Constitutional:      General: She is not in acute distress.    Appearance: Normal appearance. She is not ill-appearing.  HENT:     Head: Normocephalic and atraumatic.     Nose: Congestion present.  Cardiovascular:     Rate and Rhythm: Normal rate and regular rhythm.  Pulmonary:     Effort: Pulmonary effort is normal.     Breath sounds: Normal breath sounds. No wheezing, rhonchi or rales.  Neurological:     Mental Status: She is alert.  Psychiatric:        Mood and Affect: Mood normal.        Behavior: Behavior normal.    UC Treatments / Results  Labs (all labs ordered are listed, but only abnormal results are displayed) Labs Reviewed  SARS CORONAVIRUS 2 (TAT 6-24 HRS)    EKG   Radiology No results found.  Procedures Procedures (including critical care time)  Medications Ordered in UC Medications - No data to display  Initial Impression / Assessment and Plan / UC Course  I have reviewed the triage vital signs and the nursing notes.  Pertinent labs & imaging results that were available during my care of the patient were reviewed by me and considered in my medical decision making (see chart for details).    71 year female presents with a viral URI with cough. Awaiting COVID test results.  Atrovent nasal spray and Tessalon Perles as prescribed.  Supportive care.  Final Clinical Impressions(s) / UC Diagnoses   Final diagnoses:  Viral URI with cough     Discharge Instructions     Medication as prescribed.  Stay home.  Check my chart for COVID test results.  Take care  Dr. Lacinda Axon     ED Prescriptions    Medication Sig Dispense Auth. Provider   ipratropium (ATROVENT) 0.06 % nasal spray Place 2 sprays into both nostrils 4 (four)  times daily as needed for rhinitis. 15 mL Tamantha Saline G, DO   benzonatate (TESSALON) 200 MG capsule Take 1 capsule (200 mg total) by mouth 3 (three) times daily as needed for cough. 30 capsule Coral Spikes, DO     PDMP not reviewed this encounter.   Coral Spikes, [place] 05/23/20 [phone]

## 2020-05-24 LAB — SARS CORONAVIRUS 2 (TAT 6-24 HRS): SARS Coronavirus 2: POSITIVE — AB

## 2020-05-25 ENCOUNTER — Telehealth: Payer: Self-pay | Admitting: Unknown Physician Specialty

## 2020-05-25 NOTE — Telephone Encounter (Signed)
Called to discuss with patient about COVID-19 symptoms and the use of one of the available treatments for those with mild to moderate Covid symptoms and at a high risk of hospitalization.  Pt appears to qualify for outpatient treatment due to co-morbid conditions and/or a member of an at-risk group in accordance with the FDA Emergency Use Authorization.    Symptom onset: 2/1 Vaccinated: yes Booster? yes Immunocompromised? no Qualifiers: multiple  Pt is doing better with mild symptoms.  Sx onset >5 days and does not meet criteria for oral therapy   Brenda Lin

## 2020-11-21 ENCOUNTER — Other Ambulatory Visit: Payer: Self-pay | Admitting: Physician Assistant

## 2020-11-21 DIAGNOSIS — Z1231 Encounter for screening mammogram for malignant neoplasm of breast: Secondary | ICD-10-CM

## 2020-12-24 ENCOUNTER — Other Ambulatory Visit: Payer: Self-pay | Admitting: Family Medicine

## 2021-07-09 ENCOUNTER — Ambulatory Visit
Admission: RE | Admit: 2021-07-09 | Discharge: 2021-07-09 | Disposition: A | Discharge status: Home / Self Care | Payer: Medicare Other | Source: Ambulatory Visit | Attending: Physician Assistant | Admitting: Physician Assistant

## 2021-07-09 ENCOUNTER — Other Ambulatory Visit: Payer: Self-pay

## 2021-07-09 DIAGNOSIS — Z1231 Encounter for screening mammogram for malignant neoplasm of breast: Secondary | ICD-10-CM | POA: Diagnosis present

## 2021-07-10 ENCOUNTER — Ambulatory Visit: Payer: Medicare Other

## 2022-03-01 ENCOUNTER — Ambulatory Visit
Admission: EM | Admit: 2022-03-01 | Discharge: 2022-03-01 | Disposition: A | Discharge status: Home / Self Care | Payer: Medicare Other | Attending: Physician Assistant | Admitting: Physician Assistant

## 2022-03-01 ENCOUNTER — Encounter: Payer: Self-pay | Admitting: Emergency Medicine

## 2022-03-01 DIAGNOSIS — J45901 Unspecified asthma with (acute) exacerbation: Secondary | ICD-10-CM

## 2022-03-01 DIAGNOSIS — R051 Acute cough: Secondary | ICD-10-CM | POA: Diagnosis not present

## 2022-03-01 DIAGNOSIS — J209 Acute bronchitis, unspecified: Secondary | ICD-10-CM | POA: Diagnosis not present

## 2022-03-01 MED ORDER — IPRATROPIUM-ALBUTEROL 0.5-2.5 (3) MG/3ML IN SOLN
3.0000 mL | Freq: Once | RESPIRATORY_TRACT | Status: AC
  Administered 2022-03-01: 3 mL via RESPIRATORY_TRACT

## 2022-03-01 MED ORDER — PROMETHAZINE-DM 6.25-15 MG/5ML PO SYRP: 5.0000 mL | ORAL_SOLUTION | Freq: Four times a day (QID) | ORAL | 0 refills | Status: AC | PRN

## 2022-03-01 MED ORDER — IPRATROPIUM BROMIDE 0.06 % NA SOLN: 2.0000 | Freq: Four times a day (QID) | NASAL | 0 refills | Status: AC

## 2022-03-01 MED ORDER — ALBUTEROL SULFATE (2.5 MG/3ML) 0.083% IN NEBU: 2.5000 mg | INHALATION_SOLUTION | Freq: Four times a day (QID) | RESPIRATORY_TRACT | 2 refills | Status: AC | PRN

## 2022-03-01 NOTE — ED Provider Notes (Signed)
[facility]    CSN: [id number] Arrival date & time: 03/01/22  1419      History   Chief Complaint Chief Complaint  Patient presents with   Cough    HPI Brenda Lin is a 71 y.o. female with history of asthma and allergies.  Patient also has history of hypertension and diabetes.  She presents today for 2-week history of cough and congestion.  States that her nasal drainage and her sputum production are all clear.  She has had some sinus pressure but denies any pain.  No fever, chest pain or tightness, breathing difficulty or wheezing.  She says she has been using her inhaler as needed.  She denies any recent worsening of symptoms with says symptoms have not improved.  Reports history of bronchitis and wants to be assessed for the possibility that.  No other complaints.  HPI  Past Medical History:  Diagnosis Date   Acid reflux    Anxiety    Arthritis    Asthma    Bronchitis    Depression    Diabetes mellitus without complication ([place])    Diverticulitis 2013   Hypertension    Rhinitis     Patient Active Problem List   Diagnosis Date Noted   Lesion of nipple 03/24/2018   Encounter for screening mammogram for breast cancer 03/07/2018    Past Surgical History:  Procedure Laterality Date   APPENDECTOMY     BREAST BIOPSY Left 02/2018   u/s bx by Dr Bary Castilla- neg   BREAST DUCTAL SYSTEM EXCISION Left 06/27/2018   Procedure: EXCISION LEFT BREAST DUCTAL STRUCTURE, DIABETIC;  Surgeon: Robert Bellow, MD;  Location: [facility] ORS;  Service: General;  Laterality: Left;   COLON RESECTION  2013   Dr Marina Gravel   COLONOSCOPY  2015   Dr Ernst Breach   COLONOSCOPY WITH PROPOFOL N/A 11/22/2018   Procedure: COLONOSCOPY WITH PROPOFOL;  Surgeon: Lollie Sails, MD;  Location: [facility] ENDOSCOPY;  Service: Endoscopy;  Laterality: N/A;   diverticulosis     HERNIA REPAIR  2409   umbilical   SHOULDER ARTHROSCOPY W/ ROTATOR CUFF REPAIR Left 2016    OB History     Gravida  2   Para   2   Term      Preterm      AB      Living  2      SAB      IAB      Ectopic      Multiple      Live Births           Obstetric Comments  1st Menstrual Cycle:  13 1st Pregnancy:  20           Home Medications    Prior to Admission medications   Medication Sig Start Date End Date Taking? Authorizing Provider  albuterol (PROVENTIL) (2.5 MG/3ML) 0.083% nebulizer solution Take 3 mLs (2.5 mg total) by nebulization every 6 (six) hours as needed for wheezing or shortness of breath. 03/01/22  Yes Danton Clap, PA-C  aspirin EC 81 MG tablet Take 81 mg by mouth daily.   Yes [provider]  atorvastatin (LIPITOR) 20 MG tablet  02/21/18  Yes [provider]  ipratropium (ATROVENT) 0.06 % nasal spray Place 2 sprays into both nostrils 4 (four) times daily. 03/01/22  Yes Laurene Footman B, PA-C  losartan (COZAAR) 50 MG tablet Take 50 mg by mouth daily.    Yes [provider]  metFORMIN (GLUCOPHAGE-XR) 500 MG 24 hr tablet Take 500 mg by mouth daily with breakfast.  09/13/14  Yes [provider]  montelukast (SINGULAIR) 10 MG tablet Take 10 mg by mouth at bedtime.   Yes [provider]  Omega-3 Fatty Acids (FISH OIL) 500 MG CAPS Take by mouth.   Yes [provider]  promethazine-dextromethorphan (PROMETHAZINE-DM) 6.25-15 MG/5ML syrup Take 5 mLs by mouth 4 (four) times daily as needed. 03/01/22  Yes Danton Clap, PA-C  ACCU-CHEK AVIVA PLUS test strip  07/11/14   [provider]  albuterol (PROVENTIL HFA;VENTOLIN HFA) 108 (90 Base) MCG/ACT inhaler Inhale 2 puffs into the lungs every 4 (four) hours as needed for wheezing or shortness of breath. 1/54/00   Defelice, Jeanett Schlein, NP  benzonatate (TESSALON) 200 MG capsule Take 1 capsule (200 mg total) by mouth 3 (three) times daily as needed for cough. 05/23/20   Coral Spikes, DO  buPROPion (WELLBUTRIN XL) 150 MG 24 hr tablet as needed.  06/22/18   [provider]   fluticasone (FLONASE) 50 MCG/ACT nasal spray as needed.  10/01/14   [provider]  fluticasone (FLOVENT HFA) 110 MCG/ACT inhaler Inhale 2 puffs into the lungs 2 (two) times daily.    [provider]  loratadine (CLARITIN) 10 MG tablet Take 10 mg by mouth daily.    [provider]  allopurinol (ZYLOPRIM) 100 MG tablet Take 100 mg by mouth daily.  01/17/19  [provider]  colchicine 0.6 MG tablet Take 2 tablets (1.2 mg) initially. Repeat with 1 tablet( 0.6 mg) in one hour. Do not take anymore on the first day. On second day start taking 1 daily until gout attack has resolved. 09/08/16 01/03/19  Lorin Picket, PA-C  DULoxetine (CYMBALTA) 20 MG capsule duloxetine 20 mg capsule,delayed release  01/03/19  [provider]  omega-3 acid ethyl esters (LOVAZA) 1 g capsule Take 1,000 mg by mouth 2 (two) times daily.  01/03/19  [provider]    Family History Family History  Problem Relation Age of Onset   Diabetes Mother    Hypertension Mother    Other Father        unknown medical history   Cancer Maternal Grandmother        breast   Breast cancer Maternal Grandmother    Breast cancer Sister        63's   Bipolar disorder Sister    Breast cancer Maternal Aunt    Breast cancer Cousin        mat cousin    Social History Social History   Tobacco Use   Smoking status: Former    Packs/day: 1.00    Types: Cigarettes    Quit date: 04/21/2011    Years since quitting: 10.8   Smokeless tobacco: Never  Vaping Use   Vaping Use: Never used  Substance Use Topics   Alcohol use: Yes    Alcohol/week: 0.0 standard drinks of alcohol    Comment: rare beer   Drug use: No     Allergies   Vicodin [hydrocodone-acetaminophen]   Review of Systems Review of Systems  Constitutional:  Positive for fatigue. Negative for chills, diaphoresis and fever.  HENT:  Positive for congestion, postnasal drip, rhinorrhea and sinus pressure. Negative for ear  pain, sinus pain and sore throat.   Respiratory:  Positive for cough. Negative for shortness of breath and wheezing.   Gastrointestinal:  Negative for abdominal pain, nausea and vomiting.  Musculoskeletal:  Negative  for arthralgias and myalgias.  Skin:  Negative for rash.  Neurological:  Negative for weakness and headaches.  Hematological:  Negative for adenopathy.     Physical Exam Triage Vital Signs ED Triage Vitals  Enc Vitals Group     BP 03/01/22 1441 (!) 180/85     Pulse Rate 03/01/22 1441 90     Resp 03/01/22 1441 15     Temp 03/01/22 1441 98.3 F (36.8 C)     Temp Source 03/01/22 1441 Oral     SpO2 03/01/22 1441 98 %     Weight 03/01/22 1440 190 lb (86.2 kg)     Height 03/01/22 1440 '5\' 4"'$  (1.626 m)     Head Circumference --      Peak Flow --      Pain Score 03/01/22 1440 0     Pain Loc --      Pain Edu? --      Excl. in [place]? --    No data found.  Updated Vital Signs BP (!) 180/85 (BP Location: Left Arm)   Pulse 90   Temp 98.3 F (36.8 C) (Oral)   Resp 15   Ht '5\' 4"'$  (1.626 m)   Wt 190 lb (86.2 kg)   SpO2 98%   BMI 32.61 kg/m      Physical Exam Vitals and nursing note reviewed.  Constitutional:      General: She is not in acute distress.    Appearance: Normal appearance. She is not ill-appearing or toxic-appearing.  HENT:     Head: Normocephalic and atraumatic.     Nose: Congestion present.     Mouth/Throat:     Mouth: Mucous membranes are moist.     Pharynx: Oropharynx is clear.  Eyes:     General: No scleral icterus.       Right eye: No discharge.        Left eye: No discharge.     Conjunctiva/sclera: Conjunctivae normal.  Cardiovascular:     Rate and Rhythm: Normal rate and regular rhythm.     Heart sounds: Normal heart sounds.  Pulmonary:     Effort: Pulmonary effort is normal. No respiratory distress.     Breath sounds: Wheezing present.  Musculoskeletal:     Cervical back: Neck supple.  Skin:    General: Skin is dry.  Neurological:      General: No focal deficit present.     Mental Status: She is alert. Mental status is at baseline.     Motor: No weakness.     Gait: Gait normal.  Psychiatric:        Mood and Affect: Mood normal.        Behavior: Behavior normal.        Thought Content: Thought content normal.      UC Treatments / Results  Labs (all labs ordered are listed, but only abnormal results are displayed) Labs Reviewed - No data to display  EKG   Radiology No results found.  Procedures Procedures (including critical care time)  Medications Ordered in UC Medications  ipratropium-albuterol (DUONEB) 0.5-2.5 (3) MG/3ML nebulizer solution 3 mL (3 mLs Nebulization Given 03/01/22 1520)    Initial Impression / Assessment and Plan / UC Course  I have reviewed the triage vital signs and the nursing notes.  Pertinent labs & imaging results that were available during my care of the patient were reviewed by me and considered in my medical decision making (see chart for details).   71 year old  female with history of allergies and asthma, diabetes and hypertension presents for cough and congestion for the past 2 weeks.  Reports clear sputum production and nasal drainage.  No sinus pain, fever, breathing difficulty or wheezing.  BP is elevated at 180/85.  Reports taking over-the-counter cough medications which may increase the blood pressure.  Other vitals normal and stable.  She is overall well-appearing.  She has no respiratory distress.  On exam she does have nasal congestion and postnasal drainage.  Diffuse wheezing throughout all lung fields.  Offered patient a albuterol nebulizer treatment and she agrees.  Patient given nebulized breathing treatment and sent home with a nebulizer.  Prescribed albuterol.  Suspect she has a viral bronchitis and asthma exacerbation.  Also sent Promethazine DM.  We will hold off on prednisone given that her blood pressure is elevated and her oxygen level is maintained at 98% and  she does not like having any shortness of breath at this time.  Reviewed returning if symptoms are worsening or not improving over the next week.   Final Clinical Impressions(s) / UC Diagnoses   Final diagnoses:  Acute bronchitis, unspecified organism  Acute cough  Asthma with acute exacerbation, unspecified asthma severity, unspecified whether persistent     Discharge Instructions      -You have bronchitis and flareup of your asthma.  I have sent nebulizer solution to the pharmacy and cough medication as well as nasal spray.  Plenty rest and fluids. Return if not feeling better in the next week or if you develop discolored sputum, nasal drainage right sinus pain, fever, etc.     ED Prescriptions     Medication Sig Dispense Auth. Provider   albuterol (PROVENTIL) (2.5 MG/3ML) 0.083% nebulizer solution Take 3 mLs (2.5 mg total) by nebulization every 6 (six) hours as needed for wheezing or shortness of breath. 75 mL Laurene Footman B, PA-C   promethazine-dextromethorphan (PROMETHAZINE-DM) 6.25-15 MG/5ML syrup Take 5 mLs by mouth 4 (four) times daily as needed. 118 mL Laurene Footman B, PA-C   ipratropium (ATROVENT) 0.06 % nasal spray Place 2 sprays into both nostrils 4 (four) times daily. 15 mL Danton Clap, PA-C      PDMP not reviewed this encounter.   Danton Clap, PA-C 03/01/22 1544

## 2022-03-01 NOTE — Discharge Instructions (Addendum)
-  You have bronchitis and flareup of your asthma.  I have sent nebulizer solution to the pharmacy and cough medication as well as nasal spray.  Plenty rest and fluids. Return if not feeling better in the next week or if you develop discolored sputum, nasal drainage right sinus pain, fever, etc.

## 2022-03-01 NOTE — ED Triage Notes (Signed)
Patient c/o cough and chest congestion for 2 weeks. Patient denies fevers.

## 2022-06-01 ENCOUNTER — Other Ambulatory Visit: Payer: Self-pay | Admitting: Physician Assistant

## 2022-06-01 DIAGNOSIS — Z1231 Encounter for screening mammogram for malignant neoplasm of breast: Secondary | ICD-10-CM

## 2022-07-13 ENCOUNTER — Ambulatory Visit
Admission: RE | Admit: 2022-07-13 | Discharge: 2022-07-13 | Disposition: A | Discharge status: Home / Self Care | Payer: 59 | Source: Ambulatory Visit | Attending: Physician Assistant | Admitting: Physician Assistant

## 2022-07-13 DIAGNOSIS — Z1231 Encounter for screening mammogram for malignant neoplasm of breast: Secondary | ICD-10-CM | POA: Insufficient documentation

## 2022-08-24 ENCOUNTER — Ambulatory Visit: Payer: 59 | Attending: Physical Therapy | Admitting: Physical Therapy

## 2022-08-26 ENCOUNTER — Encounter: Payer: 59 | Admitting: Physical Therapy

## 2022-08-31 ENCOUNTER — Encounter: Payer: 59 | Admitting: Physical Therapy

## 2022-09-02 ENCOUNTER — Encounter: Payer: 59 | Admitting: Physical Therapy

## 2022-09-07 ENCOUNTER — Encounter: Payer: 59 | Admitting: Physical Therapy

## 2022-09-09 ENCOUNTER — Encounter: Payer: 59 | Admitting: Physical Therapy

## 2022-09-16 ENCOUNTER — Encounter: Payer: 59 | Admitting: Physical Therapy

## 2022-09-21 ENCOUNTER — Encounter: Payer: 59 | Admitting: Physical Therapy

## 2022-09-23 ENCOUNTER — Encounter: Payer: 59 | Admitting: Physical Therapy

## 2022-09-28 ENCOUNTER — Encounter: Payer: 59 | Admitting: Physical Therapy

## 2022-09-30 ENCOUNTER — Encounter: Payer: 59 | Admitting: Physical Therapy

## 2022-11-05 ENCOUNTER — Ambulatory Visit: Payer: 59 | Attending: Physical Therapy

## 2022-11-09 ENCOUNTER — Encounter: Payer: 59 | Admitting: Physical Therapy

## 2022-11-11 ENCOUNTER — Ambulatory Visit: Payer: 59 | Admitting: Physical Therapy

## 2022-11-11 NOTE — Therapy (Deleted)
OUTPATIENT PHYSICAL THERAPY THORACOLUMBAR EVALUATION   Patient Name: Brenda Lin MRN: [id number] DOB:05/19/1950, 72 y.o., female Today's Date: 11/11/2022  END OF SESSION:   Past Medical History:  Diagnosis Date   Acid reflux    Anxiety    Arthritis    Asthma    Bronchitis    Depression    Diabetes mellitus without complication (HCC)    Diverticulitis 2013   Hypertension    Rhinitis    Past Surgical History:  Procedure Laterality Date   APPENDECTOMY     BREAST BIOPSY Left 02/2018   u/s bx by Dr Lemar Livings- neg   BREAST DUCTAL SYSTEM EXCISION Left 06/27/2018   Procedure: EXCISION LEFT BREAST DUCTAL STRUCTURE, DIABETIC;  Surgeon: Earline Mayotte, MD;  Location: [facility] ORS;  Service: General;  Laterality: Left;   COLON RESECTION  2013   Dr Egbert Garibaldi   COLONOSCOPY  2015   Dr Unk Pinto   COLONOSCOPY WITH PROPOFOL N/A 11/22/2018   Procedure: COLONOSCOPY WITH PROPOFOL;  Surgeon: Christena Deem, MD;  Location: [facility] ENDOSCOPY;  Service: Endoscopy;  Laterality: N/A;   diverticulosis     HERNIA REPAIR  2013   umbilical   SHOULDER ARTHROSCOPY W/ ROTATOR CUFF REPAIR Left 2016   Patient Active Problem List   Diagnosis Date Noted   Lesion of nipple 03/24/2018   Encounter for screening mammogram for breast cancer 03/07/2018    PCP: Laurann Montana, PA  REFERRING PROVIDER: Laurann Montana, PA  REFERRING DIAG: ***  RATIONALE FOR EVALUATION AND TREATMENT: {HABREHAB:27488}  THERAPY DIAG: No diagnosis found.  ONSET DATE: ***  FOLLOW-UP APPT SCHEDULED WITH REFERRING PROVIDER: {yes/no:20286}    SUBJECTIVE:                                                                                                                                                                                         SUBJECTIVE STATEMENT:  Pt is a 72 year old female with primary c/o LBP/R-sided sciatica.   PERTINENT HISTORY: Pt is a 72 year old female with primary c/o LBP/R-sided sciatica.    PAIN:    Pain Intensity: Present: /10, Best: /10, Worst: /10 Pain location: *** Pain Quality: {PAIN DESCRIPTION:21022940}  Radiating: {yes/no:20286}  Numbness/Tingling: {yes/no:20286} Focal Weakness: {yes/no:20286} Aggravating factors: *** Relieving factors: *** 24-hour pain behavior: *** How long can you sit: How long can you stand: History of prior back injury, pain, surgery, or therapy: {yes/no:20286} Dominant hand: {RIGHT/LEFT:20294} Imaging: {yes/no:20286}  Red flags: Negative for bowel/bladder changes, saddle paresthesia, personal history of cancer, h/o spinal tumors, h/o compression fx, h/o abdominal aneurysm, abdominal pain, chills/fever, night sweats, nausea, vomiting, unrelenting pain, first onset of insidious LBP <20  y/o  PRECAUTIONS: {Therapy precautions:24002}  WEIGHT BEARING RESTRICTIONS: {Yes ***/No:24003}  FALLS: Has patient fallen in last 6 months? {fallsyesno:27318}  Living Environment Lives with: {[facility] lives with:25569::"lives with their family"} Lives in: {Lives in:25570} Stairs: {opstairs:27293} Has following equipment at home: {Assistive devices:23999}  Prior level of function: {PLOF:24004}  Occupational demands:   Hobbies:   Patient Goals: ***   OBJECTIVE:  Patient Surveys  {rehab surveys:24030}  Cognition Patient is oriented to person, place, and time.  Recent memory is intact.  Remote memory is intact.  Attention span and concentration are intact.  Expressive speech is intact.  Patient's fund of knowledge is within normal limits for educational level.    Gross Musculoskeletal Assessment Tremor: None Bulk: Normal Tone: Normal No visible step-off along spinal column, no signs of scoliosis  GAIT: Distance walked: *** Assistive device utilized: {Assistive devices:23999} Level of assistance: {Levels of assistance:24026} Comments: ***  Posture: Lumbar lordosis: WNL Iliac crest height: Equal bilaterally Lumbar lateral shift:  Negative  AROM AROM (Normal range in degrees) AROM   Lumbar   Flexion (65)   Extension (30)   Right lateral flexion (25)   Left lateral flexion (25)   Right rotation (30)   Left rotation (30)       Hip Right Left  Flexion (125)    Extension (15)    Abduction (40)    Adduction     Internal Rotation (45)    External Rotation (45)        Knee    Flexion (135)    Extension (0)        Ankle    Dorsiflexion (20)    Plantarflexion (50)    Inversion (35)    Eversion (15)    (* = pain; Blank rows = not tested)  LE MMT: MMT (out of 5) Right  Left   Hip flexion    Hip extension    Hip abduction    Hip adduction    Hip internal rotation    Hip external rotation    Knee flexion    Knee extension    Ankle dorsiflexion    Ankle plantarflexion    Ankle inversion    Ankle eversion    (* = pain; Blank rows = not tested)  Sensation Grossly intact to light touch throughout bilateral LEs as determined by testing dermatomes L2-S2. Proprioception, stereognosis, and hot/cold testing deferred on this date.  Reflexes R/L Knee Jerk (L3/4): 2+/2+  Ankle Jerk (S1/2): 2+/2+   Muscle Length Hamstrings: R: {NEGATIVE/POSITIVE ZOX:09604} L: {NEGATIVE/POSITIVE VWU:98119} Ely (quadriceps): R: {NEGATIVE/POSITIVE JYN:82956} L: {NEGATIVE/POSITIVE OZH:08657} Thomas (hip flexors): R: {NEGATIVE/POSITIVE QIO:96295} L: {NEGATIVE/POSITIVE MWU:13244} Ober: R: {NEGATIVE/POSITIVE WNU:27253} L: {NEGATIVE/POSITIVE GUY:40347}  Palpation Location Right Left         Lumbar paraspinals    Quadratus Lumborum    Gluteus Maximus    Gluteus Medius    Deep hip external rotators    PSIS    Fortin's Area (SIJ)    Greater Trochanter    (Blank rows = not tested) Graded on 0-4 scale (0 = no pain, 1 = pain, 2 = pain with wincing/grimacing/flinching, 3 = pain with withdrawal, 4 = unwilling to allow palpation)  Passive Accessory Intervertebral Motion Pt denies reproduction of back pain with CPA L1-L5 and  UPA bilaterally L1-L5. Generally, hypomobile throughout  Special Tests Lumbar Radiculopathy and Discogenic: Centralization and Peripheralization (SN 92, -LR 0.12): {NEGATIVE/POSITIVE FOR:19998} Slump (SN 83, -LR 0.32): R: {NEGATIVE/POSITIVE QQV:95638} L: {NEGATIVE/POSITIVE FOR:19998} SLR (SN 92, -  LR 0.29): R: {NEGATIVE/POSITIVE IEP:32951} L:  {NEGATIVE/POSITIVE OAC:16606} Crossed SLR (SP 90): R: {NEGATIVE/POSITIVE TKZ:60109} L: {NEGATIVE/POSITIVE NAT:55732}  Facet Joint: Extension-Rotation (SN 100, -LR 0.0): R: {NEGATIVE/POSITIVE KGU:54270} L: {NEGATIVE/POSITIVE WCB:76283}  Lumbar Foraminal Stenosis: Lumbar quadrant (SN 70): R: {NEGATIVE/POSITIVE TDV:76160} L: {NEGATIVE/POSITIVE VPX:10626}  Hip: FABER (SN 81): R: {NEGATIVE/POSITIVE FOR:19998} L: {NEGATIVE/POSITIVE RSW:54627} FADIR (SN 94): R: {NEGATIVE/POSITIVE FOR:19998} L: {NEGATIVE/POSITIVE OJJ:00938} Hip scour (SN 50): R: {NEGATIVE/POSITIVE HWE:99371} L: {NEGATIVE/POSITIVE IRC:78938}  SIJ:  Thigh Thrust (SN 88, -LR 0.18) : R: {NEGATIVE/POSITIVE BOF:75102} L: {NEGATIVE/POSITIVE HEN:27782}  Piriformis Syndrome: FAIR Test (SN 88, SP 83): R: {NEGATIVE/POSITIVE UMP:53614} L: {NEGATIVE/POSITIVE ERX:54008}  Functional Tasks Lifting: Deep squat: Sit to stand: Forward Step-Down Test: R:  L:  Lateral Step-Down Test: R:  L:   Beighton scale  LEFT  RIGHT           1. Passive dorsiflexion and hyperextension of the fifth MCP joint beyond 90  0 0   2. Passive apposition of the thumb to the flexor aspect of the forearm  0  0   3. Passive hyperextension of the elbow beyond 10  0  0   4. Passive hyperextension of the knee beyond 10  0  0   5. Active forward flexion of the trunk with the knees fully extended so that the palms of the hands rest flat on the floor   0   TOTAL         0/ 9      TODAY'S TREATMENT: DATE: ***     PATIENT EDUCATION:  Education details: *** Person educated: {Person educated:25204} Education method:  {Education Method:25205} Education comprehension: {Education Comprehension:25206}   HOME EXERCISE PROGRAM:     ASSESSMENT:  CLINICAL IMPRESSION: Patient is a *** y.o. *** who was seen today for physical therapy evaluation and treatment for ***.   OBJECTIVE IMPAIRMENTS: {opptimpairments:25111}.   ACTIVITY LIMITATIONS: {activitylimitations:27494}  PARTICIPATION LIMITATIONS: {participationrestrictions:25113}  PERSONAL FACTORS: {Personal factors:25162} are also affecting patient's functional outcome.   REHAB POTENTIAL: {rehabpotential:25112}  CLINICAL DECISION MAKING: {clinical decision making:25114}  EVALUATION COMPLEXITY: {Evaluation complexity:25115}   GOALS: Goals reviewed with patient? {yes/no:20286}  SHORT TERM GOALS: Target date: {follow up:25551}  Pt will be independent with HEP in order to improve strength and decrease back pain to improve pain-free function at home and work. Baseline: *** Goal status: INITIAL   LONG TERM GOALS: Target date: {follow up:25551}  Pt will increase FOTO to at least *** to demonstrate significant improvement in function at home and work related to back pain  Baseline:  Goal status: INITIAL  2.  Pt will decrease worst back pain by at least 2 points on the NPRS in order to demonstrate clinically significant reduction in back pain. Baseline: *** Goal status: INITIAL  3.  Pt will decrease mODI score by at least 13 points in order demonstrate clinically significant reduction in back pain/disability.       Baseline: *** Goal status: INITIAL  4.  *** Baseline: *** Goal status: INITIAL   PLAN: PT FREQUENCY: 1-2x/week  PT DURATION: {rehab duration:25117}  PLANNED INTERVENTIONS: Therapeutic exercises, Therapeutic activity, Neuromuscular re-education, Balance training, Gait training, Patient/Family education, Self Care, Joint mobilization, Joint manipulation, Vestibular training, Canalith repositioning, Orthotic/Fit training, DME  instructions, Dry Needling, Electrical stimulation, Spinal manipulation, Spinal mobilization, Cryotherapy, Moist heat, Taping, Traction, Ultrasound, Ionotophoresis 4mg /ml Dexamethasone, Manual therapy, and Re-evaluation.  PLAN FOR NEXT SESSION: ***   Lynnea Maizes PT, DPT, G[facility]  Gertie Exon, PT 11/11/2022, 7:58 AM

## 2022-11-16 ENCOUNTER — Encounter: Payer: 59 | Admitting: Physical Therapy

## 2022-11-18 ENCOUNTER — Encounter: Payer: 59 | Admitting: Physical Therapy

## 2022-11-23 ENCOUNTER — Encounter: Payer: 59 | Admitting: Physical Therapy

## 2022-11-25 ENCOUNTER — Encounter: Payer: 59 | Admitting: Physical Therapy

## 2022-11-30 ENCOUNTER — Encounter: Payer: 59 | Admitting: Physical Therapy

## 2022-12-02 ENCOUNTER — Encounter: Payer: 59 | Admitting: Physical Therapy

## 2022-12-07 ENCOUNTER — Encounter: Payer: 59 | Admitting: Physical Therapy

## 2022-12-09 ENCOUNTER — Encounter: Payer: 59 | Admitting: Physical Therapy

## 2022-12-14 ENCOUNTER — Encounter: Payer: 59 | Admitting: Physical Therapy

## 2022-12-16 ENCOUNTER — Encounter: Payer: 59 | Admitting: Physical Therapy

## 2023-06-07 ENCOUNTER — Other Ambulatory Visit: Payer: Self-pay | Admitting: Internal Medicine

## 2023-06-07 DIAGNOSIS — Z1231 Encounter for screening mammogram for malignant neoplasm of breast: Secondary | ICD-10-CM

## 2023-07-14 ENCOUNTER — Ambulatory Visit
Admission: RE | Admit: 2023-07-14 | Discharge: 2023-07-14 | Disposition: A | Discharge status: Home / Self Care | Payer: 59 | Source: Ambulatory Visit | Attending: Internal Medicine | Admitting: Internal Medicine

## 2023-07-14 DIAGNOSIS — Z1231 Encounter for screening mammogram for malignant neoplasm of breast: Secondary | ICD-10-CM | POA: Diagnosis present

## 2023-09-22 IMAGING — MG MM DIGITAL SCREENING BILAT W/ TOMO AND CAD
8 series · 8 of 24 positions shown · non-contrast
Comparison: Previous exam(s).

CLINICAL DATA: Screening.

EXAM:
DIGITAL SCREENING BILATERAL MAMMOGRAM WITH TOMOSYNTHESIS AND CAD
TECHNIQUE: Bilateral screening digital craniocaudal and mediolateral oblique
mammograms were obtained. Bilateral screening digital breast
tomosynthesis was performed. The images were evaluated with
computer-aided detection.

[R CC synth-2D]
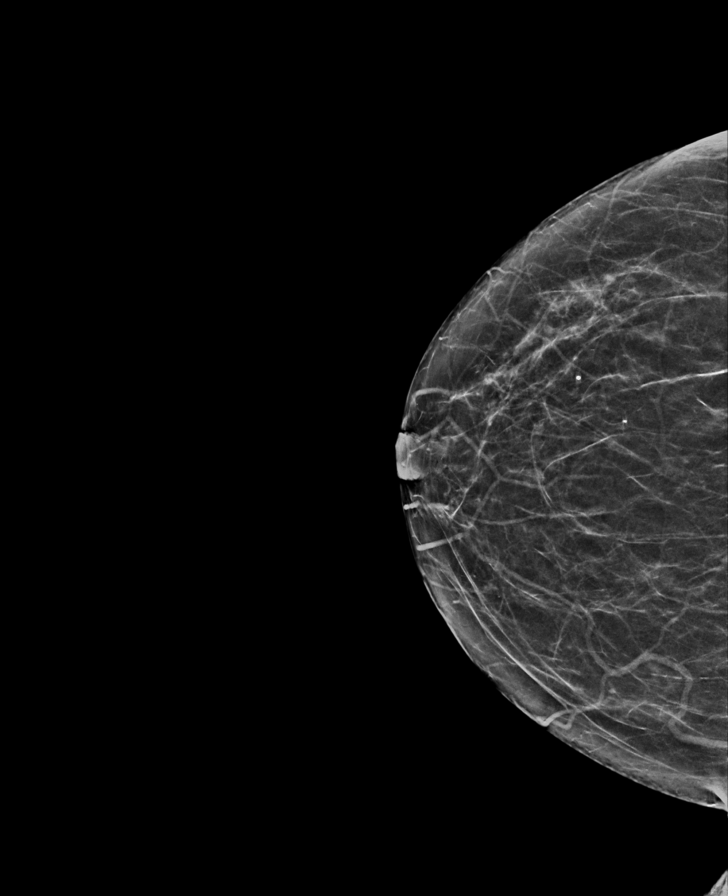

[R MLO synth-2D]
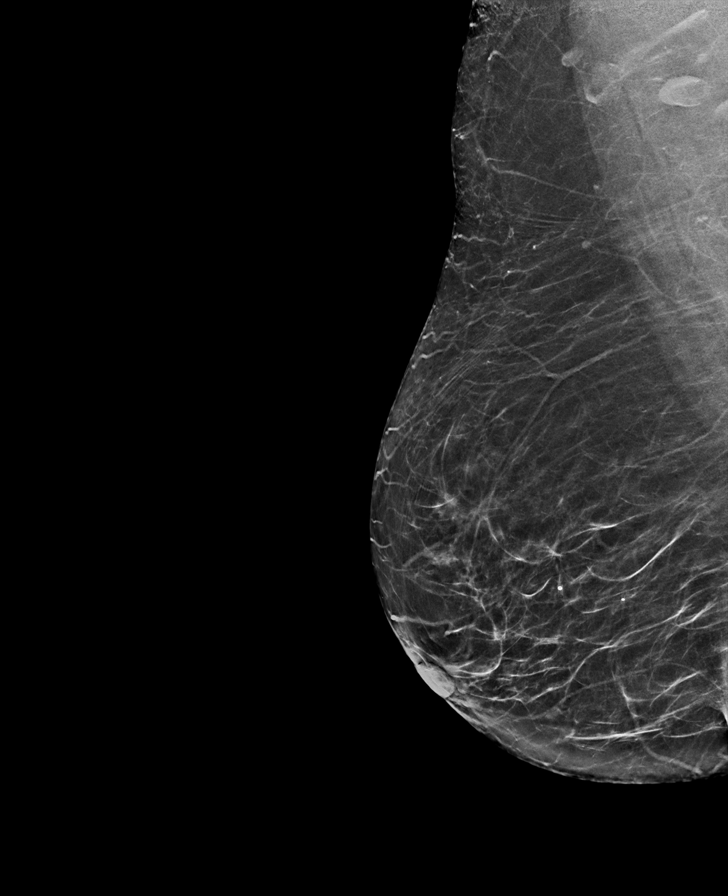

[L CC synth-2D]
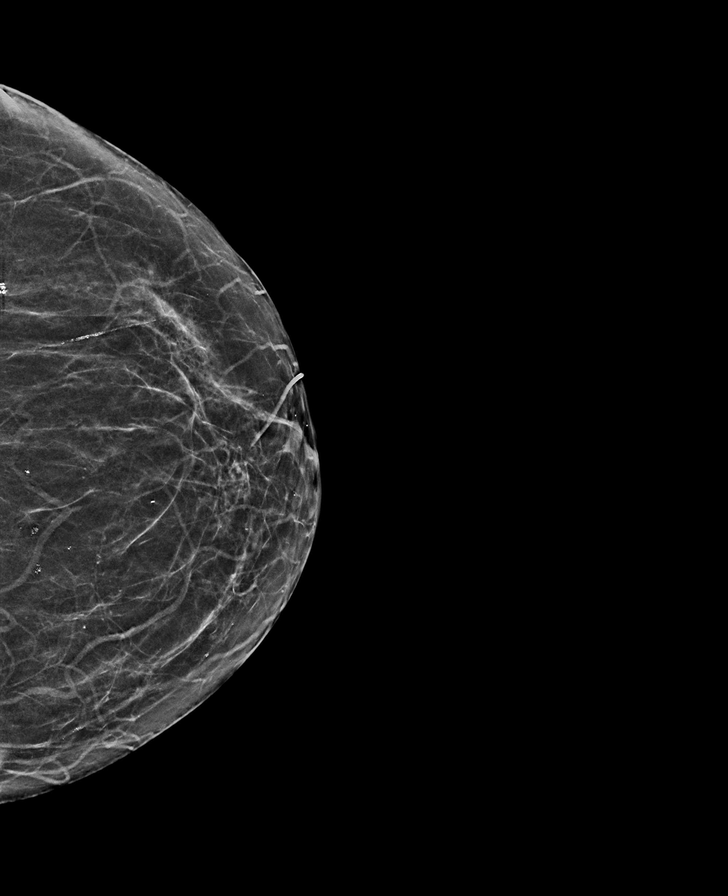

[L MLO synth-2D]
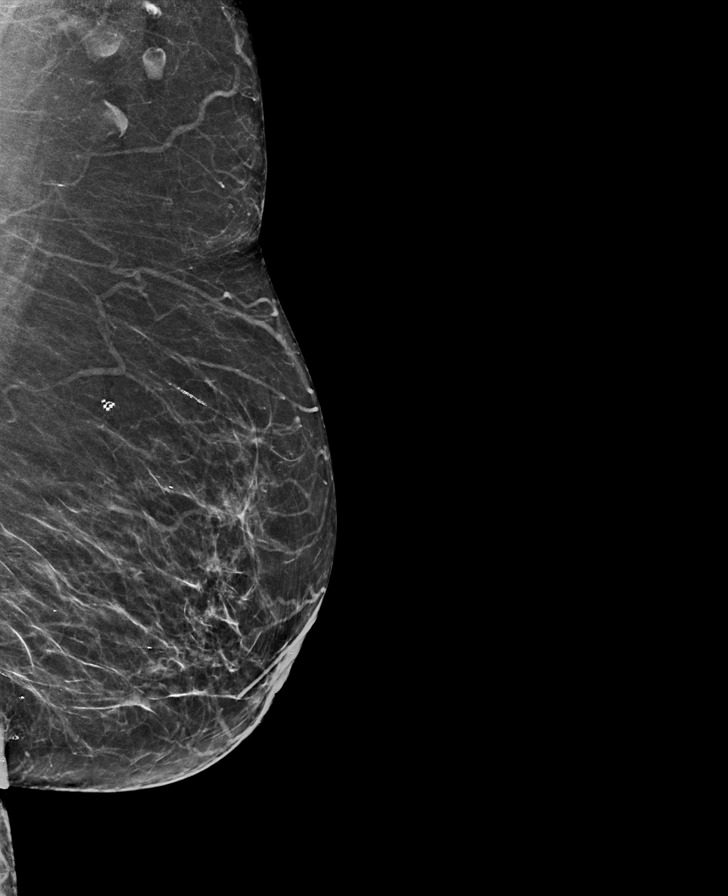

[L MLO tomo · tomo slice 32/63.0]
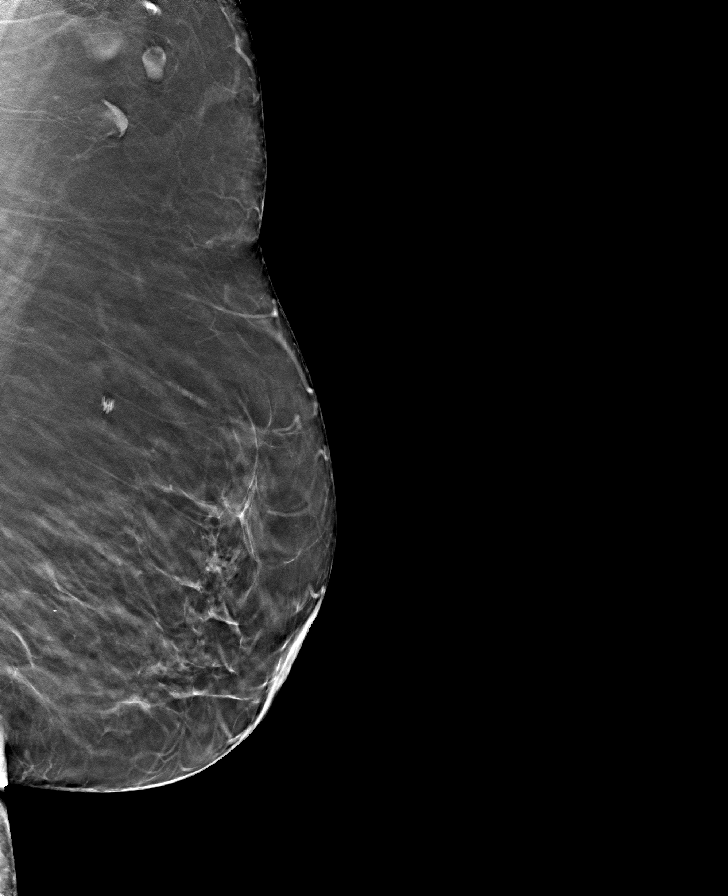

[L CC tomo · tomo slice 27/52.0]
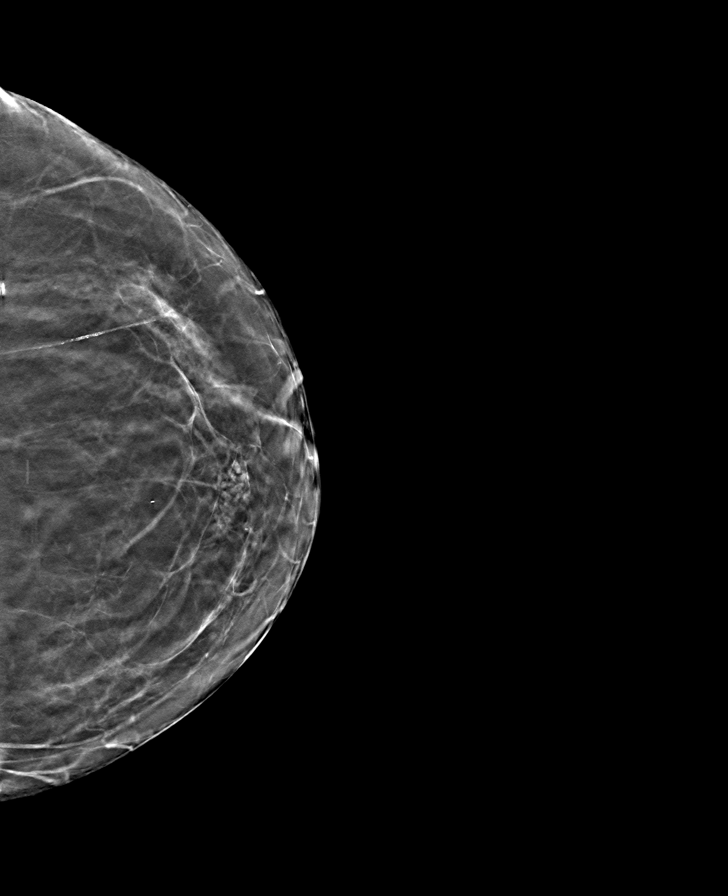

[R MLO tomo · tomo slice 33/66.0]
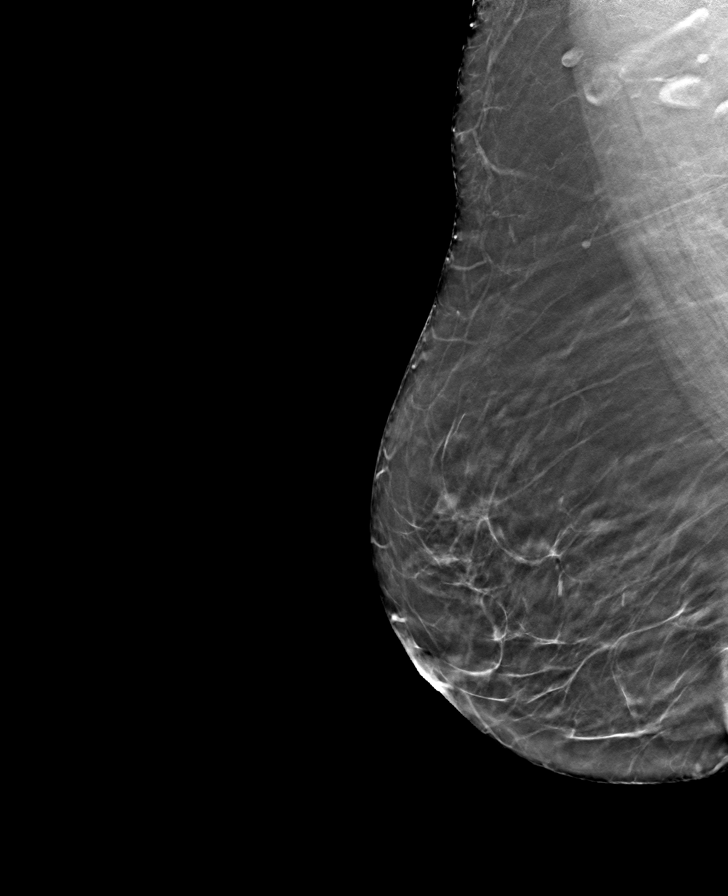

[R CC tomo · tomo slice 27/54.0]
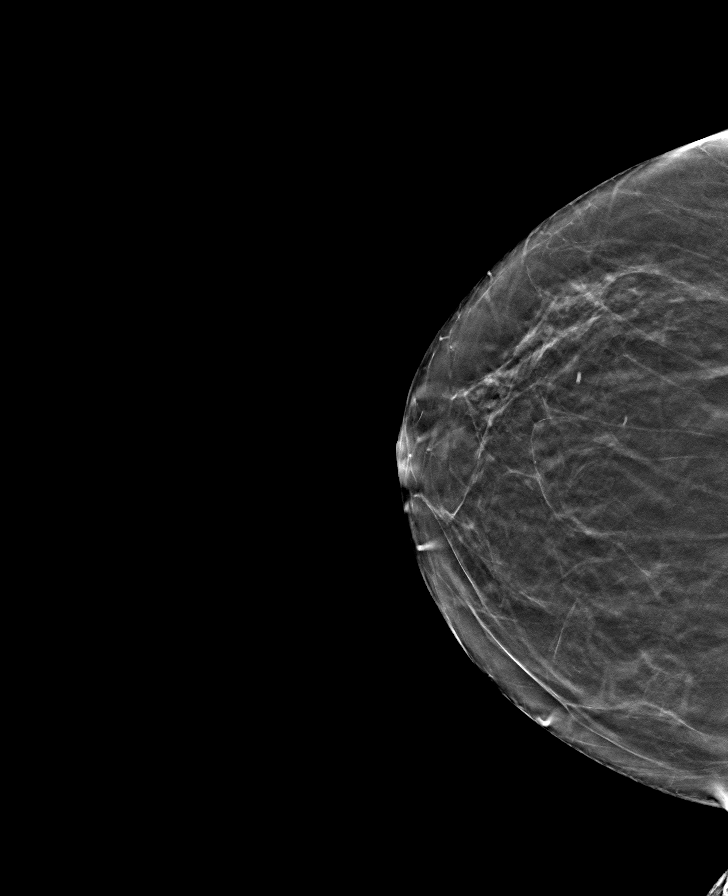

[8 of 24 positions shown; findings below may reference images not displayed]

ACR Breast Density Category c: The breast tissue is heterogeneously
dense, which may obscure small masses.
FINDINGS: There are no findings suspicious for malignancy.
IMPRESSION: No mammographic evidence of malignancy. A result letter of this
screening mammogram will be mailed directly to the patient.

RECOMMENDATION:
Screening mammogram in one year. (Code:Q3-W-BC3)

BI-RADS CATEGORY  1: Negative.

## 2024-05-04 ENCOUNTER — Other Ambulatory Visit: Payer: Self-pay | Admitting: Internal Medicine

## 2024-05-04 DIAGNOSIS — Z1231 Encounter for screening mammogram for malignant neoplasm of breast: Secondary | ICD-10-CM

## 2024-07-17 ENCOUNTER — Ambulatory Visit
